# Patient Record
Sex: Female | Born: 1960 | Race: White | Hispanic: No | Smoking: Former smoker
Health system: Southern US, Community
[De-identification: ages and names within clinical notes are randomized; demographics above are authoritative.]

## PROBLEM LIST (undated history)

## (undated) DIAGNOSIS — D249 Benign neoplasm of unspecified breast: Secondary | ICD-10-CM

## (undated) HISTORY — PX: BREAST SURGERY: SHX581

## (undated) HISTORY — PX: TUMOR REMOVAL: SHX12

---

## 2006-04-17 ENCOUNTER — Inpatient Hospital Stay (HOSPITAL_COMMUNITY): Admission: EM | Admit: 2006-04-17 | Discharge: 2006-04-19 | Payer: Self-pay | Admitting: Emergency Medicine

## 2006-04-19 ENCOUNTER — Ambulatory Visit: Payer: Self-pay | Admitting: Oncology

## 2006-04-24 ENCOUNTER — Encounter: Admission: RE | Admit: 2006-04-24 | Discharge: 2006-04-24 | Payer: Self-pay | Admitting: Oncology

## 2006-04-24 ENCOUNTER — Ambulatory Visit (HOSPITAL_COMMUNITY): Payer: Self-pay | Admitting: Oncology

## 2006-04-24 ENCOUNTER — Encounter (HOSPITAL_COMMUNITY): Admission: RE | Admit: 2006-04-24 | Discharge: 2006-05-24 | Payer: Self-pay | Admitting: Oncology

## 2006-06-02 ENCOUNTER — Ambulatory Visit (HOSPITAL_COMMUNITY): Payer: Self-pay | Admitting: Oncology

## 2006-06-02 ENCOUNTER — Encounter: Admission: RE | Admit: 2006-06-02 | Discharge: 2006-06-02 | Payer: Self-pay | Admitting: Oncology

## 2006-06-02 ENCOUNTER — Encounter (HOSPITAL_COMMUNITY): Admission: RE | Admit: 2006-06-02 | Discharge: 2006-07-02 | Payer: Self-pay | Admitting: Oncology

## 2009-02-03 ENCOUNTER — Emergency Department (HOSPITAL_COMMUNITY): Admission: EM | Admit: 2009-02-03 | Discharge: 2009-02-03 | Payer: Self-pay | Admitting: Emergency Medicine

## 2009-04-08 ENCOUNTER — Emergency Department (HOSPITAL_COMMUNITY): Admission: EM | Admit: 2009-04-08 | Discharge: 2009-04-08 | Payer: Self-pay | Admitting: Emergency Medicine

## 2009-04-30 ENCOUNTER — Emergency Department (HOSPITAL_COMMUNITY): Admission: EM | Admit: 2009-04-30 | Discharge: 2009-04-30 | Payer: Self-pay | Admitting: Emergency Medicine

## 2009-05-02 ENCOUNTER — Emergency Department (HOSPITAL_COMMUNITY): Admission: EM | Admit: 2009-05-02 | Discharge: 2009-05-02 | Payer: Self-pay | Admitting: Emergency Medicine

## 2009-09-08 ENCOUNTER — Emergency Department (HOSPITAL_COMMUNITY): Admission: EM | Admit: 2009-09-08 | Discharge: 2009-09-08 | Payer: Self-pay | Admitting: Emergency Medicine

## 2009-09-13 ENCOUNTER — Emergency Department (HOSPITAL_COMMUNITY): Admission: EM | Admit: 2009-09-13 | Discharge: 2009-09-13 | Payer: Self-pay | Admitting: Emergency Medicine

## 2009-09-19 ENCOUNTER — Emergency Department (HOSPITAL_COMMUNITY): Admission: EM | Admit: 2009-09-19 | Discharge: 2009-09-19 | Payer: Self-pay | Admitting: Emergency Medicine

## 2011-01-16 LAB — CULTURE, ROUTINE-ABSCESS

## 2011-02-25 NOTE — H&P (Signed)
NAMESARANN, Lisa Mullins                 ACCOUNT NO.:  [id number]   MEDICAL RECORD NO.:  [id number]          PATIENT TYPE:  INP   LOCATION:  A312                          FACILITY:  [facility]   PHYSICIAN:  Lisa Mullins, M.D.DATE OF BIRTH:  December 10, 1960   DATE OF ADMISSION:  04/17/2006  DATE OF DISCHARGE:  LH                                HISTORY & PHYSICAL   PRIMARY CARE PHYSICIAN:  Unassigned.   ADMISSION DIAGNOSES:  1.  Neutropenia.  2.  Neutropenic fever.  3.  Pharyngitis, probably viral ?EBV infection.  4.  Hypokalemia.  5.  Dehydration.   CHIEF COMPLAINT:  Sore throat and fevers.   HISTORY OF PRESENT ILLNESS:  Lisa Mullins is a 50 year old Caucasian female  who presented to the emergency room with complaints of sore throat. The  patient said she also has had some chills. She has had difficulty swallowing  food. She has also had some nausea but no vomiting. The patient reports some  neck tenderness. The patient said she had a similar episode back in January  of 2007 during which she also had generalized muscle aches and joint pains.  She went to the local hospital where she was told that she had a viral  infection. She was also told that her white count was low at the time. She  spent 7 days in [facility] hospital after which she made a full recovery.   The patient reports having similar symptoms now. Evaluation in the emergency  room revealed that she was febrile. She was also noted to have significant  neutropenia. The patient is now readmitted for further evaluation. Dr.  Mariel Mullins was notified of the admission.   The patient denies any weight loss. No cough. No sputum production. Denies  any contact with anyone with a sore throat who has been ill around her.   She has no little children around the house.   PAST MEDICAL HISTORY:  History of neutropenia with sore throat back in  January of 2007. Otherwise was unremarkable.   MEDICATIONS:  None.   ALLERGIES:   CODEINE.   SOCIAL HISTORY:  The patient is single. Does not drink alcohol. Smokes about  a pack of cigarettes and had a prior history of drug abuse. She works.   FAMILY HISTORY:  The patient says that one of her grandparents had leukemia.  Otherwise, no history of hypertension, diabetes, coronary artery disease or  congestive heart failure.   PHYSICAL EXAMINATION:  GENERAL:  Conscious, alert, comfortable, not in acute  distress.  VITAL SIGNS:  On arrival here, blood pressure was 108/73, pulse 128,  respirations 20, temperature 101.8. Oxygen saturation was 98% on room air.  HEENT:  Normocephalic, atraumatic. Oral mucosa shows erythema. No exudates  were noted. No thrush was seen.  NECK:  She had some tenderness with mild adenitis in the submandibular area.  LUNGS:  Clear clinically with good air entry bilaterally.  HEART:  S1 and S2 tachycardic. No S3, S4, gallops or rubs.  ABDOMEN:  Soft and nontender. Bowel sounds positive. No mass palpable.  EXTREMITIES:  No edema. No induration  or tenderness.  CENTRAL NERVOUS SYSTEM:  Grossly intact with no focal deficits.   LABORATORY/DIAGNOSTIC DATA:  White blood cell count was 2.0, hemoglobin of  11.5, hematocrit 3.5, platelet count 313. ANC of 60. Monocytes were 64%.  Sodium was 135, potassium 2.6, chloride 98, CO2 26, glucose 89, BUN 4,  creatinine 0.7, calcium 9.1. Strep throat test was negative. Blood cultures  have been drawn and are pending.   ASSESSMENT AND PLAN:  Lisa Mullins is a 50 year old Caucasian female  presenting with fever and sore throat. She does have severe neutropenia with  a fever. It is unclear why patient keeps having these neutropenic episodes  usually proceeded by her sore throat. I suspect that this may be Malachi Carl Virus infection. We will send blood for Epstein-Barr serology. The  patient has been started on Fortaz as per recommendation of Dr. Mariel Mullins  who also saw patient in consult.   Will give her some  Chloraseptic spray to help give her some symptomatic  relief of the sore throat. Further tests or treatments will depend on  recommendations as per hematologist.   I have discussed the above plan with patient and the importance of  admission, and she verbalized understanding. Will also replace her potassium  with IV and p.o.      Lisa Mullins, M.D.  Electronically Signed     AM/MEDQ  D:  04/18/2006  T:  04/18/2006  Job:  [id number]

## 2011-02-25 NOTE — Discharge Summary (Signed)
Lisa Mullins, RIEDE NO.:  [id number]   MEDICAL RECORD NO.:  [id number]          PATIENT TYPE:  INP   LOCATION:  A312                          FACILITY:  [facility]   PHYSICIAN:  Osvaldo Shipper, MD     DATE OF BIRTH:  1960-10-18   DATE OF ADMISSION:  04/17/2006  DATE OF DISCHARGE:  07/11/2007LH                                 DISCHARGE SUMMARY   Please note: Patient went against medical advice on April 19, 2006.   Please see H&P dictated at the time of admission for details regarding this  patient's presenting illness.   HOSPITAL COURSE:  Briefly, this is a 50 year old Caucasian female who really  does not have any medical problems except that she was detected to have  neutropenia back in January 2007.  She also had sore throat at that time,  and it was felt the neutropenia was secondary to a viral infection.  Apparently she underwent extensive testing in the form of HIV and other  viral markers which were all negative.  The patient presented to the  emergency department here complaining of sore throat and fevers.  She was  again noted to have neutropenia with a total white count of about 2,000 with  3% neutrophils.  She was also found to be mildly anemic.  Platelet counts  were normal.  MCV was 77.  She was also hypokalemic.   The patient was put on Fortaz based on Dr. Thornton Papas recommendations.  Strep screen was negative.  Blood cultures were done which have all been  negative.  EBV IgM was 0.21 which is really not high.  Her IgG's are high.  HIV test is pending at this hospital. She also underwent iron profile  studies which showed iron to be 12, percent saturation 5, TIBC 241, ferritin  254.  She was not given any Neupogen.  Soon her fever started improving.  Her white count also started improving on its own; however, she was still  neutropenic with a white count of 800 on July 11.   On July 11, when I came in to evaluate the patient, she wanted to be  discharged.  I told her that we would like her to stay at least 1 or 2 more  days to make sure that she is stable.  However, patient really wanted to go  home as she said she could not afford to pay her bills.  Despite multiple  attempts, she signed herself against medical advise.   MEDICATIONS:  Because of the severity of her illness, we did prescribe her:  Ciprofloxacin and Augmentin for 10 days.   FOLLOW-UP:  She was asked to follow up with Dr. Mariel Sleet, and she said she had an  appointment with him next Monday.      Osvaldo Shipper, MD  Electronically Signed     GK/MEDQ  D:  04/19/2006  T:  04/19/2006  Job:  [id number]   cc:   Ladona Horns. Mariel Sleet, MD  Fax: [phone]

## 2013-11-18 ENCOUNTER — Emergency Department (HOSPITAL_COMMUNITY)
Admission: EM | Admit: 2013-11-18 | Discharge: 2013-11-18 | Disposition: A | Discharge status: Home / Self Care | Payer: PRIVATE HEALTH INSURANCE | Attending: Emergency Medicine | Admitting: Emergency Medicine

## 2013-11-18 ENCOUNTER — Emergency Department (HOSPITAL_COMMUNITY): Payer: PRIVATE HEALTH INSURANCE

## 2013-11-18 ENCOUNTER — Encounter (HOSPITAL_COMMUNITY): Payer: Self-pay | Admitting: Emergency Medicine

## 2013-11-18 DIAGNOSIS — R1110 Vomiting, unspecified: Secondary | ICD-10-CM | POA: Insufficient documentation

## 2013-11-18 DIAGNOSIS — J111 Influenza due to unidentified influenza virus with other respiratory manifestations: Secondary | ICD-10-CM | POA: Insufficient documentation

## 2013-11-18 DIAGNOSIS — F17200 Nicotine dependence, unspecified, uncomplicated: Secondary | ICD-10-CM | POA: Insufficient documentation

## 2013-11-18 DIAGNOSIS — R197 Diarrhea, unspecified: Secondary | ICD-10-CM | POA: Insufficient documentation

## 2013-11-18 DIAGNOSIS — Z872 Personal history of diseases of the skin and subcutaneous tissue: Secondary | ICD-10-CM | POA: Insufficient documentation

## 2013-11-18 HISTORY — DX: Benign neoplasm of unspecified breast: D24.9

## 2013-11-18 LAB — CBC WITH DIFFERENTIAL/PLATELET
Basophils Absolute: 0 10*3/uL (ref 0.0–0.1)
Basophils Relative: 1 % (ref 0–1)
EOS PCT: 3 % (ref 0–5)
Eosinophils Absolute: 0.2 10*3/uL (ref 0.0–0.7)
HEMATOCRIT: 43.1 % (ref 36.0–46.0)
Hemoglobin: 14.1 g/dL (ref 12.0–15.0)
LYMPHS ABS: 2.1 10*3/uL (ref 0.7–4.0)
LYMPHS PCT: 32 % (ref 12–46)
MCH: 28.8 pg (ref 26.0–34.0)
MCHC: 32.7 g/dL (ref 30.0–36.0)
MCV: 88 fL (ref 78.0–100.0)
Monocytes Absolute: 0.7 10*3/uL (ref 0.1–1.0)
Monocytes Relative: 11 % (ref 3–12)
NEUTROS ABS: 3.5 10*3/uL (ref 1.7–7.7)
NEUTROS PCT: 54 % (ref 43–77)
Platelets: 331 10*3/uL (ref 150–400)
RBC: 4.9 MIL/uL (ref 3.87–5.11)
RDW: 12.9 % (ref 11.5–15.5)
WBC: 6.5 10*3/uL (ref 4.0–10.5)

## 2013-11-18 LAB — COMPREHENSIVE METABOLIC PANEL
ALT: 12 U/L (ref 0–35)
AST: 15 U/L (ref 0–37)
Albumin: 4.2 g/dL (ref 3.5–5.2)
Alkaline Phosphatase: 94 U/L (ref 39–117)
BUN: 23 mg/dL (ref 6–23)
CO2: 26 mEq/L (ref 19–32)
Calcium: 9.6 mg/dL (ref 8.4–10.5)
Chloride: 102 mEq/L (ref 96–112)
Creatinine, Ser: 0.65 mg/dL (ref 0.50–1.10)
GFR calc Af Amer: >90 mL/min (ref >90)
GLUCOSE: 102 mg/dL — AB (ref 70–99)
Potassium: 4.4 mEq/L (ref 3.7–5.3)
SODIUM: 139 meq/L (ref 137–147)
Total Bilirubin: 0.3 mg/dL (ref 0.3–1.2)
Total Protein: 7.6 g/dL (ref 6.0–8.3)

## 2013-11-18 MED ORDER — OSELTAMIVIR PHOSPHATE 75 MG PO CAPS: 75.0000 mg | ORAL_CAPSULE | Freq: Two times a day (BID) | ORAL | Status: DC

## 2013-11-18 MED ORDER — OSELTAMIVIR PHOSPHATE 75 MG PO CAPS
75.0000 mg | ORAL_CAPSULE | Freq: Once | ORAL | Status: AC
  Administered 2013-11-18: 75 mg via ORAL
  Filled 2013-11-18: qty 1

## 2013-11-18 MED ORDER — IBUPROFEN 800 MG PO TABS: 800.0000 mg | ORAL_TABLET | Freq: Three times a day (TID) | ORAL | Status: DC

## 2013-11-18 MED ORDER — ONDANSETRON HCL 4 MG/2ML IJ SOLN
4.0000 mg | Freq: Once | INTRAMUSCULAR | Status: AC
  Administered 2013-11-18: 4 mg via INTRAVENOUS
  Filled 2013-11-18: qty 2

## 2013-11-18 MED ORDER — SODIUM CHLORIDE 0.9 % IV BOLUS (SEPSIS)
1000.0000 mL | Freq: Once | INTRAVENOUS | Status: AC
  Administered 2013-11-18: 1000 mL via INTRAVENOUS

## 2013-11-18 NOTE — Discharge Instructions (Signed)
Drink plenty of  Fluids and follow up if not improving

## 2013-11-18 NOTE — ED Notes (Signed)
Patient c/o flu like symptoms-fever, cough, generalized body aches, nausea, vomiting, and diarrhea since yesterday. Denies urinary symptoms. Per patient last took tylenol 30 minutes ago.

## 2013-11-18 NOTE — ED Provider Notes (Signed)
CSN: [id number]     Arrival date & time 11/18/13  1445 History  This chart was scribed for Maudry Diego, MD by Elby Beck, ED Scribe. This patient was seen in room APA10/APA10 and the patient's care was started at 4:25 PM.   Chief Complaint  Patient presents with  . Fever    Patient is a 53 y.o. female presenting with cough. The history is provided by the patient. No language interpreter was used.  Cough Cough characteristics:  Productive Sputum characteristics:  Green Severity:  Moderate Onset quality:  Gradual Duration:  2 days Timing:  Intermittent Progression:  Worsening Chronicity:  New Smoker: yes   Relieved by:  None tried Worsened by:  Nothing tried Ineffective treatments:  None tried Associated symptoms: fever, headaches and myalgias   Associated symptoms: no eye discharge     HPI Comments: Lisa Mullins is a 53 y.o. female who presents to the Emergency Department complaining of a cough, productive of green mucous.over the past 2 days. She reports associated headache, fever, generalized mylagias, nausea, episodes of emesis and episodes of diarrhea. ED temperature is 98.2 F. She suspects that she has the flu. She reports taking Tylenol with some relief of her fever. She is a current every day smoker and reports that she normally does not normally have a cough at baseline. She denies urinary symptoms.   Past Medical History  Diagnosis Date  . Benign tumor of breast    Past Surgical History  Procedure Laterality Date  . Breast surgery     Family History  Problem Relation Age of Onset  . Stroke Other    History  Substance Use Topics  . Smoking status: Current Every Day Smoker -- 0.75 packs/day for 20 years    Types: Cigarettes  . Smokeless tobacco: Never Used  . Alcohol Use: No   OB History   Grav Para Term Preterm Abortions TAB SAB Ect Mult Living   1 1 1       1      Review of Systems  Constitutional: Positive for fever. Negative for appetite change  and fatigue.  HENT: Negative for ear discharge and sinus pressure.   Eyes: Negative for discharge.  Respiratory: Positive for cough.   Gastrointestinal: Positive for nausea, vomiting and diarrhea. Negative for abdominal pain.  Genitourinary: Negative for dysuria, frequency and hematuria.  Musculoskeletal: Positive for myalgias. Negative for back pain.  Neurological: Positive for headaches. Negative for seizures.  Psychiatric/Behavioral: Negative for hallucinations.  All other systems reviewed and are negative.   Allergies  Review of patient's allergies indicates no known allergies.  Home Medications   Current Outpatient Rx  Name  Route  Sig  Dispense  Refill  . acetaminophen (TYLENOL) 500 MG tablet   Oral   Take 1,000 mg by mouth every 6 (six) hours as needed.           Triage Vitals: BP 96/68  Pulse 103  Temp(Src) 98.2 F (36.8 C) (Oral)  Resp 18  Ht 5\' 6"  (1.676 m)  Wt 138 lb (62.596 kg)  BMI 22.28 kg/m2  SpO2 96%  Physical Exam  Nursing note and vitals reviewed. Constitutional: She is oriented to person, place, and time. She appears well-developed.  HENT:  Head: Normocephalic.  Mucous membranes are dry.   Eyes: Conjunctivae and EOM are normal. No scleral icterus.  Neck: Neck supple. No thyromegaly present.  Cardiovascular: Normal rate and regular rhythm.  Exam reveals no gallop and no friction rub.  No murmur heard. Pulmonary/Chest: No stridor. She has no wheezes. She has no rales. She exhibits no tenderness.  Abdominal: She exhibits no distension. There is no tenderness. There is no rebound.  Musculoskeletal: Normal range of motion. She exhibits no edema.  Lymphadenopathy:    She has no cervical adenopathy.  Neurological: She is oriented to person, place, and time. She exhibits normal muscle tone. Coordination normal.  Skin: No rash noted. No erythema.  Psychiatric: She has a normal mood and affect. Her behavior is normal.    ED Course  Procedures  (including critical care time)  DIAGNOSTIC STUDIES: Oxygen Saturation is 96% on RA, normal by my interpretation.    COORDINATION OF CARE: 4:28 PM- Discussed plan to obtain a CXR and diagnostic lab work. Will also order IV fluids and Zofran in the ED. Pt advised of plan for treatment and pt agrees.  6:11 PM- Recheck and discussed CXR findings, indicating possible emphysema. Pt admits that she 0.5-0.75 packs/day.    Medications  sodium chloride 0.9 % bolus 1,000 mL (0 mLs Intravenous Stopped 11/18/13 1806)  ondansetron (ZOFRAN) injection 4 mg (4 mg Intravenous Given 11/18/13 1716)   Labs Review Labs Reviewed  COMPREHENSIVE METABOLIC PANEL - Abnormal; Notable for the following:    Glucose, Bld 102 (*)    All other components within normal limits  CBC WITH DIFFERENTIAL   Imaging Review Dg Chest 2 View  11/18/2013   CLINICAL DATA:  Fever, flu like symptoms, history smoking  EXAM: CHEST  2 VIEW  COMPARISON:  04/18/2006  FINDINGS: Upper normal heart size.  Normal mediastinal contours and pulmonary vascularity.  Bronchitic and question mild emphysematous changes raising question of COPD.  No acute infiltrate, pleural effusion or pneumothorax.  Bones appear demineralized.  IMPRESSION: Bronchitic and question mild emphysematous changes raising question of COPD.  No acute abnormalities.   Electronically Signed   By: Lavonia Dana M.D.   On: 11/18/2013 17:51    EKG Interpretation   None       MDM   Final diagnoses:  None   The chart was scribed for me under my direct supervision.  I personally performed the history, physical, and medical decision making and all procedures in the evaluation of this patient.Maudry Diego, MD 11/19/13 [phone]

## 2014-08-11 ENCOUNTER — Encounter (HOSPITAL_COMMUNITY): Payer: Self-pay | Admitting: Emergency Medicine

## 2014-09-30 ENCOUNTER — Emergency Department (HOSPITAL_COMMUNITY)
Admission: EM | Admit: 2014-09-30 | Discharge: 2014-09-30 | Disposition: A | Discharge status: Home / Self Care | Payer: PRIVATE HEALTH INSURANCE | Attending: Emergency Medicine | Admitting: Emergency Medicine

## 2014-09-30 ENCOUNTER — Emergency Department (HOSPITAL_COMMUNITY): Payer: PRIVATE HEALTH INSURANCE

## 2014-09-30 ENCOUNTER — Encounter (HOSPITAL_COMMUNITY): Payer: Self-pay

## 2014-09-30 DIAGNOSIS — Z720 Tobacco use: Secondary | ICD-10-CM | POA: Insufficient documentation

## 2014-09-30 DIAGNOSIS — IMO0001 Reserved for inherently not codable concepts without codable children: Secondary | ICD-10-CM

## 2014-09-30 DIAGNOSIS — Z8742 Personal history of other diseases of the female genital tract: Secondary | ICD-10-CM | POA: Insufficient documentation

## 2014-09-30 DIAGNOSIS — W01198A Fall on same level from slipping, tripping and stumbling with subsequent striking against other object, initial encounter: Secondary | ICD-10-CM | POA: Insufficient documentation

## 2014-09-30 DIAGNOSIS — Y998 Other external cause status: Secondary | ICD-10-CM | POA: Insufficient documentation

## 2014-09-30 DIAGNOSIS — Z79899 Other long term (current) drug therapy: Secondary | ICD-10-CM | POA: Insufficient documentation

## 2014-09-30 DIAGNOSIS — S40021A Contusion of right upper arm, initial encounter: Secondary | ICD-10-CM

## 2014-09-30 DIAGNOSIS — Y9389 Activity, other specified: Secondary | ICD-10-CM | POA: Insufficient documentation

## 2014-09-30 DIAGNOSIS — T1490XA Injury, unspecified, initial encounter: Secondary | ICD-10-CM

## 2014-09-30 DIAGNOSIS — Y929 Unspecified place or not applicable: Secondary | ICD-10-CM | POA: Insufficient documentation

## 2014-09-30 DIAGNOSIS — S40011A Contusion of right shoulder, initial encounter: Secondary | ICD-10-CM | POA: Insufficient documentation

## 2014-09-30 MED ORDER — ACETAMINOPHEN-CODEINE #3 300-30 MG PO TABS: 1.0000 | ORAL_TABLET | Freq: Four times a day (QID) | ORAL | Status: DC | PRN

## 2014-09-30 MED ORDER — IBUPROFEN 800 MG PO TABS: 800.0000 mg | ORAL_TABLET | Freq: Three times a day (TID) | ORAL | Status: DC

## 2014-09-30 MED ORDER — HYDROCODONE-ACETAMINOPHEN 5-325 MG PO TABS: 1.0000 | ORAL_TABLET | ORAL | Status: DC | PRN

## 2014-09-30 NOTE — ED Notes (Signed)
Patient allowed said nurse to apply sling arm foam to left arm. Patient tolerated well.

## 2014-09-30 NOTE — ED Notes (Signed)
Pt reports tripped and fell yesterday. C/O pain to left shoulder.

## 2014-09-30 NOTE — ED Notes (Addendum)
Went into room to discharge patient. Gave patient information on discharge prescriptions including tylenol # 3 and patient stated "I can't take that, it gives me a headache." When attempting to give patient discharge instructions, patient states "I could have told you that I didn't have a fracture or dislocation. I need to see the doctor back in here and I am going to call my doctor while he is in the room. I'm not putting up with this." Advised Lily Kocher, PA.

## 2014-09-30 NOTE — ED Notes (Signed)
Patient states "I don't appreciate you looking at me like that. I am not a dope fiend and because of all these dope addicts I can't get what I need for pain. He made me feel like a dope fiend and you made me feel like a dope fiend. I am sick of being treated this way. I have to pay higher prices at the store because people steal stuff all the time." Patient also states "My doctor in [place] normally gives me hydrocodone 5's for my pain."

## 2014-09-30 NOTE — ED Provider Notes (Signed)
CSN: [id number]     Arrival date & time 09/30/14  1157 History   First MD Initiated Contact with Patient 09/30/14 1232     Chief Complaint  Patient presents with  . Shoulder Pain     (Consider location/radiation/quality/duration/timing/severity/associated sxs/prior Treatment) HPI Comments: Patient is a 53 year old female who presents to the emergency department with left shoulder pain. The patient states that on last night she stumbled over her dog, fell, and injured the left shoulder. She complains she has even more pain today than she did on last night. She can move the shoulder, but it gives her "a great deal of pain". The patient has not had any previous operation on the shoulder, however she has had disc surgery on her neck. She is also concerned as to whether or not she could've done anything to the disc in her neck that would be causing her shoulder to hurt so bad". The patient denies being on any anticoagulation medications. Resting the shoulder helps some, but movement of the shoulder causes increase of the pain.  Patient is a 53 y.o. female presenting with shoulder pain. The history is provided by the patient.  Shoulder Pain Associated symptoms: no back pain and no neck pain     Past Medical History  Diagnosis Date  . Benign tumor of breast    Past Surgical History  Procedure Laterality Date  . Breast surgery     Family History  Problem Relation Age of Onset  . Stroke Other    History  Substance Use Topics  . Smoking status: Current Every Day Smoker -- 0.75 packs/day for 20 years    Types: Cigarettes  . Smokeless tobacco: Never Used  . Alcohol Use: No   OB History    Gravida Para Term Preterm AB TAB SAB Ectopic Multiple Living   1 1 1       1      Review of Systems  Constitutional: Negative for activity change.       All ROS Neg except as noted in HPI  HENT: Negative for nosebleeds.   Eyes: Negative for photophobia and discharge.  Respiratory: Negative for  cough, shortness of breath and wheezing.   Cardiovascular: Negative for chest pain and palpitations.  Gastrointestinal: Negative for abdominal pain and blood in stool.  Genitourinary: Negative for dysuria, frequency and hematuria.  Musculoskeletal: Positive for arthralgias. Negative for back pain and neck pain.  Skin: Negative.   Neurological: Negative for dizziness, seizures and speech difficulty.  Psychiatric/Behavioral: Negative for hallucinations and confusion.      Allergies  Review of patient's allergies indicates no known allergies.  Home Medications   Prior to Admission medications   Medication Sig Start Date End Date Taking? Authorizing Provider  acetaminophen (TYLENOL) 500 MG tablet Take 1,000 mg by mouth every 6 (six) hours as needed for mild pain.    Yes Historical Provider, MD  ibuprofen (ADVIL,MOTRIN) 800 MG tablet Take 1 tablet (800 mg total) by mouth 3 (three) times daily. Patient not taking: Reported on 09/30/2014 11/18/13   Maudry Diego, MD  oseltamivir (TAMIFLU) 75 MG capsule Take 1 capsule (75 mg total) by mouth every 12 (twelve) hours. Patient not taking: Reported on 09/30/2014 11/18/13   Maudry Diego, MD   BP 134/84 mmHg  Pulse 95  Temp(Src) 98.3 F (36.8 C) (Oral)  Resp 20  Ht 5\' 6"  (1.676 m)  Wt 122 lb (55.339 kg)  BMI 19.70 kg/m2  SpO2 100% Physical Exam  Constitutional: She  is oriented to person, place, and time. She appears well-developed and well-nourished.  Non-toxic appearance.  HENT:  Head: Normocephalic.  Right Ear: Tympanic membrane and external ear normal.  Left Ear: Tympanic membrane and external ear normal.  Eyes: EOM and lids are normal. Pupils are equal, round, and reactive to light.  Neck: Normal range of motion. Neck supple. Carotid bruit is not present.  Cardiovascular: Normal rate, regular rhythm, normal heart sounds, intact distal pulses and normal pulses.   Pulmonary/Chest: Breath sounds normal. No respiratory distress.   Abdominal: Soft. Bowel sounds are normal. There is no tenderness. There is no guarding.  Musculoskeletal: Normal range of motion.  There is full range of motion of the fingers, wrist, and elbow of the left upper extremity. There is no deformity of the humerus area. There is pain to palpation around the scapula area, but no evidence of scapula dislocation. There is no pain or discomfort of the clavicle. No deformity appreciated. There is pain to palpation and attempted range of motion of the shoulder on the left. There is no effusion appreciated. The radial pulses are 2+.  Lymphadenopathy:       Head (right side): No submandibular adenopathy present.       Head (left side): No submandibular adenopathy present.    She has no cervical adenopathy.  Neurological: She is alert and oriented to person, place, and time. She has normal strength. No cranial nerve deficit or sensory deficit. She exhibits normal muscle tone. Coordination normal.  Skin: Skin is warm and dry.  Psychiatric: She has a normal mood and affect. Her speech is normal.  Nursing note and vitals reviewed.   ED Course  At the time of discharge the patient was being fitted with a sling when she said to the nurse that she did not appreciate the way I looked at her. She said I looked at her as though she was a "dope pain". The patient states that she cannot take Tylenol codeine. She states it gives her a migraine headache. This information is not in her file under the allergy status. She requests to speak with me in her room again.  Chaperoned by nurse L. Shore. I asked the patient what I had done that made her feel that I was looking at her as a dope pain. She says that when she asked for something for pain that I looked at her strange. She went on to say that she does not abuse drugs, however she did mention that her son is currently incarcerated because of drug use. Later she went on to say how upset she is that holiday is here and she  will be without her son. I reminded the patient that we did not have it on file that Tylenol codeine causes her to have migraines. I have written a prescription for Norco 5/325 #15 tablets. I've encouraged the patient to see her physician in [place] if she should have any additional pain management problems, or to return to the emergency department if any emergent changes.   Procedures (including critical care time) Labs Review Labs Reviewed - No data to display  Imaging Review Dg Shoulder Left  09/30/2014   CLINICAL DATA:  Fall yesterday.  Pain  EXAM: LEFT SHOULDER - 2+ VIEW  COMPARISON:  None.  FINDINGS: There is no evidence of fracture or dislocation. There is no evidence of arthropathy or other focal bone abnormality. Soft tissues are unremarkable.  IMPRESSION: Negative.   Electronically Signed   By:  Franchot Gallo M.D.   On: 09/30/2014 12:58     EKG Interpretation None      MDM  X-ray of the left shoulder is negative for fracture or dislocation. Vital signs are well within normal limits. No neurovascular compromise appreciated on upper extremity examination.  Patient will be placed in a sling, she'll be treated with ibuprofen and Tylenol codeine. Patient is to follow-up with her primary physician next week.    Final diagnoses:  Injury    *I have reviewed nursing notes, vital signs, and all appropriate lab and imaging results for this patient.**    Lenox Ahr, PA-C 09/30/14 [address], PA-C 09/30/14 1433  Nat Christen, MD 09/30/14 [phone]

## 2014-09-30 NOTE — Discharge Instructions (Signed)
Your x-ray is negative for fracture or dislocation. Your examination is negative for any acute neurological or vascular compromise at this time. Please see your primary physician, or your specialist as sone as possible. May use ibuprofen 3 times daily for soreness. May use Tylenol codeine every 6 hours if needed for pain. Tylenol codeine may cause drowsiness, please use with caution.

## 2015-05-13 IMAGING — CR DG SHOULDER 2+V*L*
3 series · 3 of 3 positions shown · non-contrast
Comparison: None.

CLINICAL DATA: Fall yesterday.  Pain

EXAM:
LEFT SHOULDER - 2+ VIEW

[view not recorded (1 of 3)]
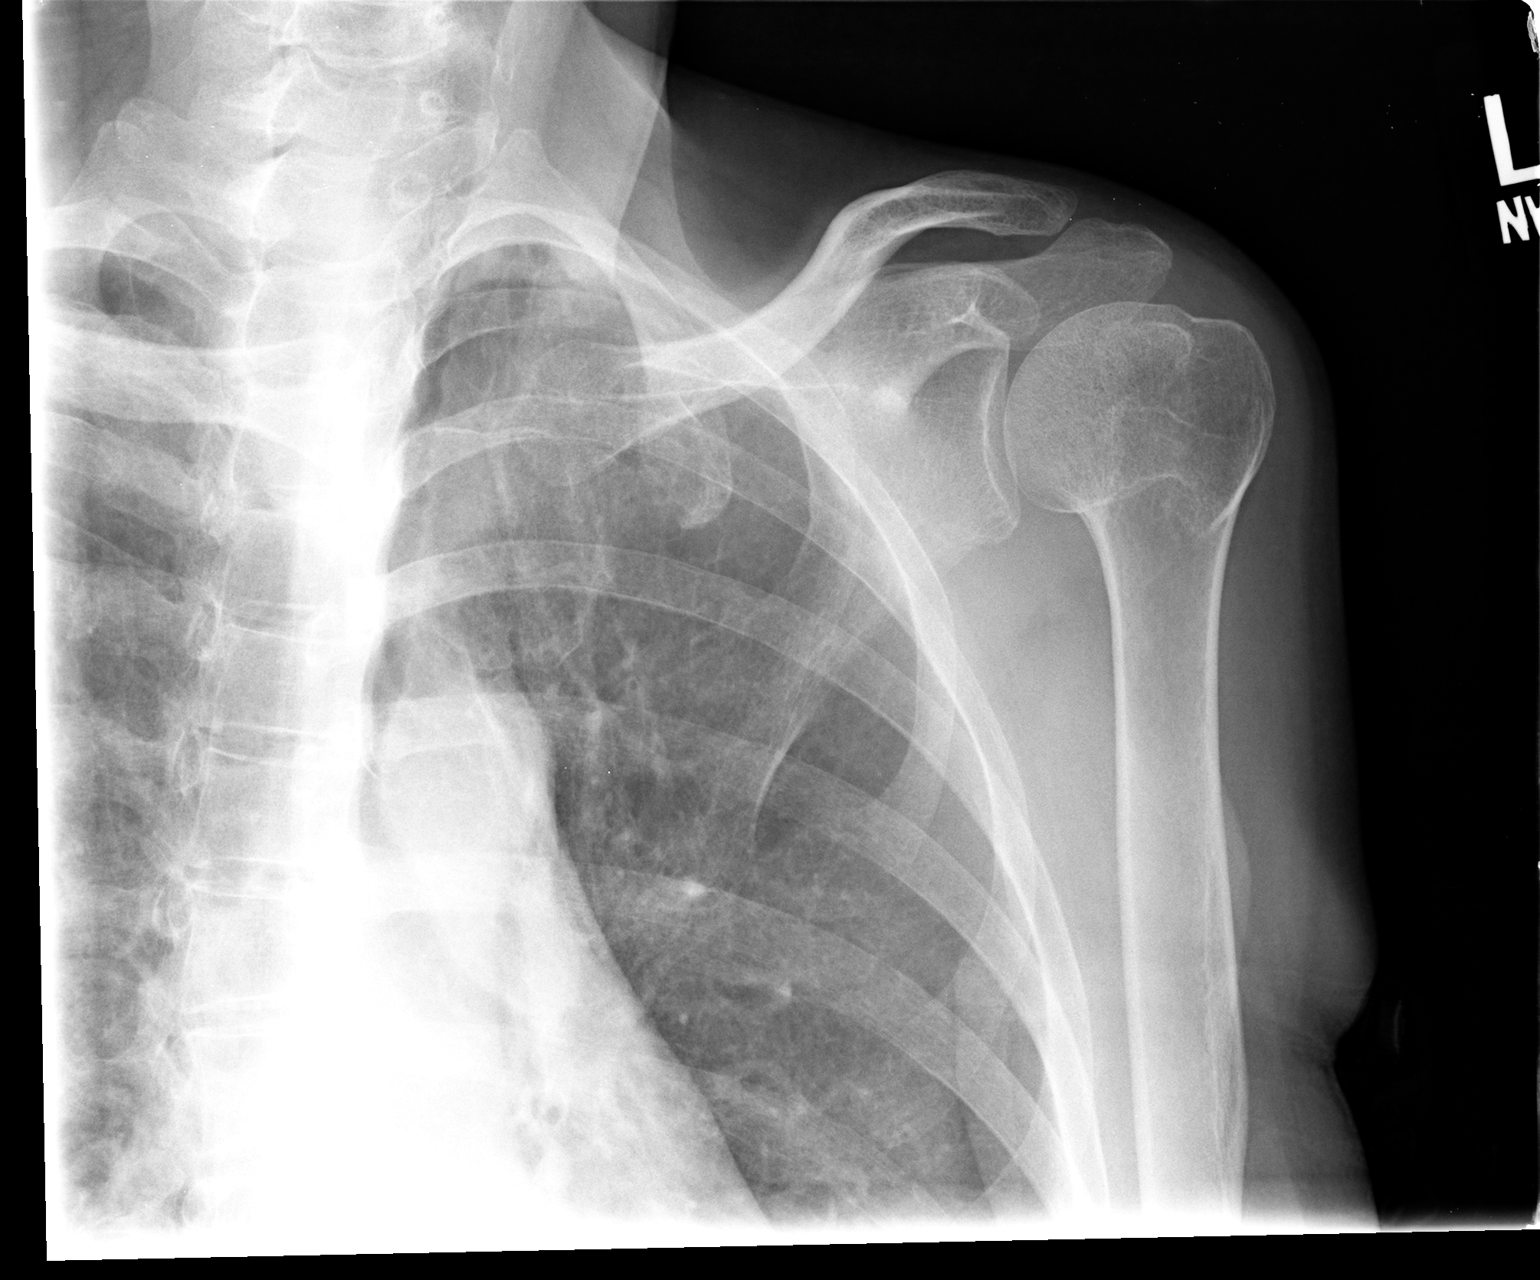

[view not recorded (2 of 3)]
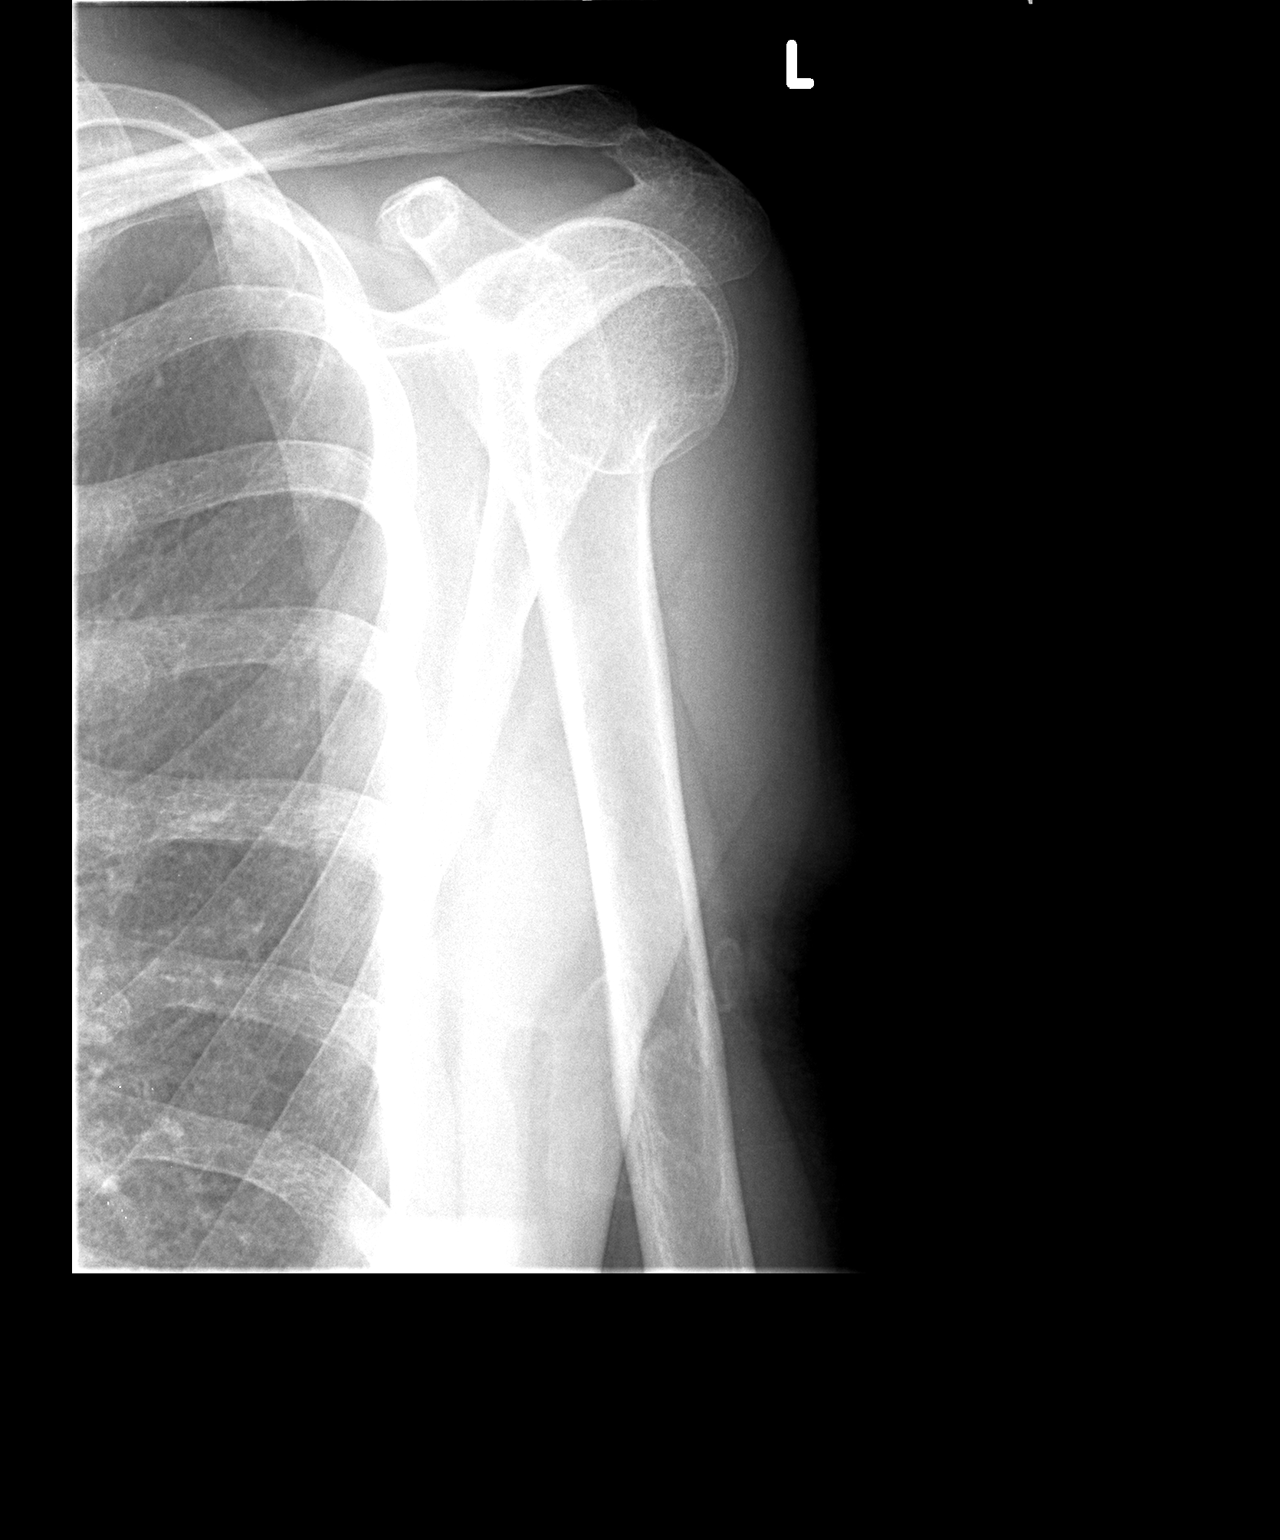

[view not recorded (3 of 3)]
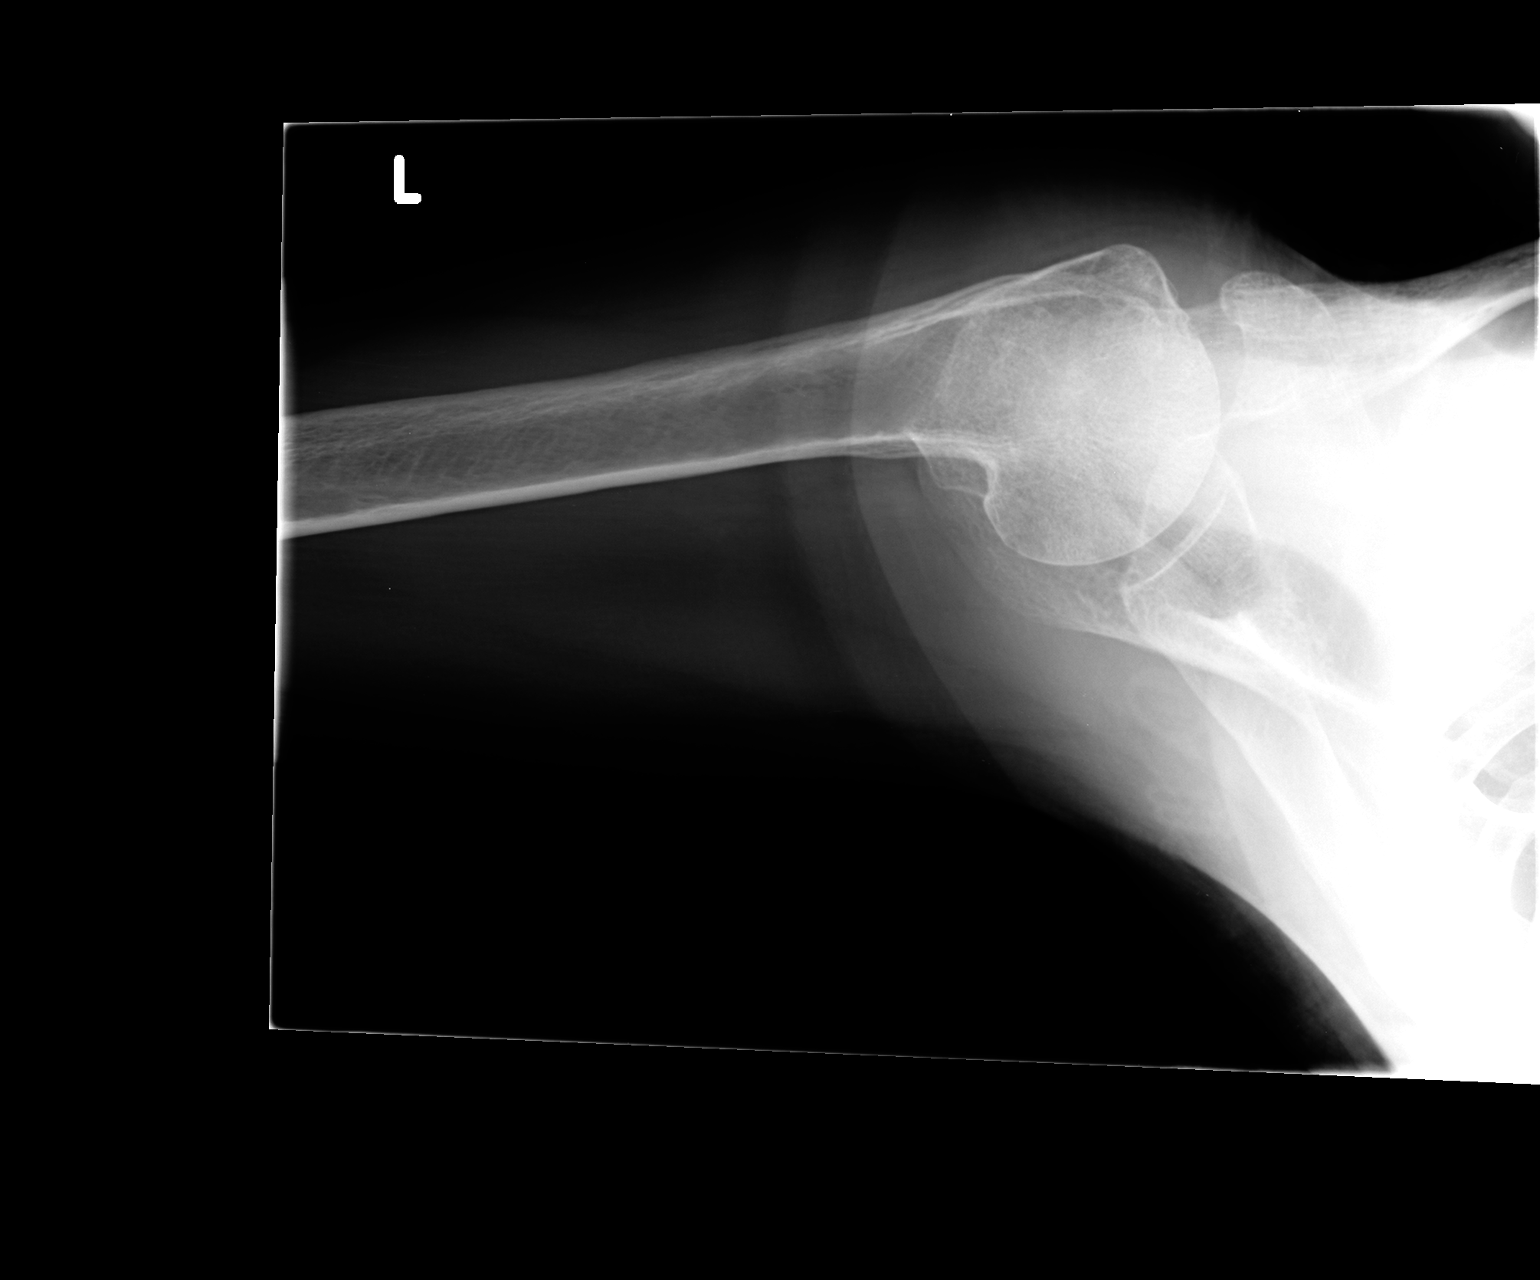

[3 of 3 positions shown; findings below may reference images not displayed]

FINDINGS: There is no evidence of fracture or dislocation. There is no
evidence of arthropathy or other focal bone abnormality. Soft
tissues are unremarkable.
IMPRESSION: Negative.

## 2015-11-01 ENCOUNTER — Emergency Department (HOSPITAL_COMMUNITY)
Admission: EM | Admit: 2015-11-01 | Discharge: 2015-11-01 | Disposition: A | Discharge status: Home / Self Care | Payer: Self-pay | Attending: Emergency Medicine | Admitting: Emergency Medicine

## 2015-11-01 ENCOUNTER — Encounter (HOSPITAL_COMMUNITY): Payer: Self-pay | Admitting: Emergency Medicine

## 2015-11-01 DIAGNOSIS — R05 Cough: Secondary | ICD-10-CM | POA: Insufficient documentation

## 2015-11-01 DIAGNOSIS — F17210 Nicotine dependence, cigarettes, uncomplicated: Secondary | ICD-10-CM | POA: Insufficient documentation

## 2015-11-01 NOTE — ED Notes (Signed)
Pt reports fever, cough, congestion, h/a, diarrhea, emesis x1 week.  Pt unable to keep food down.  Pt alert and oriented, ambulatory.

## 2015-11-01 NOTE — ED Notes (Signed)
Per nurse in main side of ED, pt was seen leaving ED. Ambulatory.

## 2015-11-04 ENCOUNTER — Emergency Department (HOSPITAL_COMMUNITY)
Admission: EM | Admit: 2015-11-04 | Discharge: 2015-11-04 | Disposition: A | Discharge status: Home / Self Care | Payer: Self-pay | Attending: Emergency Medicine | Admitting: Emergency Medicine

## 2015-11-04 ENCOUNTER — Emergency Department (HOSPITAL_COMMUNITY): Payer: Self-pay

## 2015-11-04 ENCOUNTER — Encounter (HOSPITAL_COMMUNITY): Payer: Self-pay | Admitting: Emergency Medicine

## 2015-11-04 DIAGNOSIS — J40 Bronchitis, not specified as acute or chronic: Secondary | ICD-10-CM

## 2015-11-04 DIAGNOSIS — F17210 Nicotine dependence, cigarettes, uncomplicated: Secondary | ICD-10-CM | POA: Insufficient documentation

## 2015-11-04 DIAGNOSIS — J209 Acute bronchitis, unspecified: Secondary | ICD-10-CM | POA: Insufficient documentation

## 2015-11-04 LAB — CBC WITH DIFFERENTIAL/PLATELET
BASOS ABS: 0.1 10*3/uL (ref 0.0–0.1)
BASOS PCT: 1 %
EOS PCT: 4 %
Eosinophils Absolute: 0.2 10*3/uL (ref 0.0–0.7)
HCT: 42.5 % (ref 36.0–46.0)
Hemoglobin: 14 g/dL (ref 12.0–15.0)
Lymphocytes Relative: 28 %
Lymphs Abs: 1.6 10*3/uL (ref 0.7–4.0)
MCH: 29.4 pg (ref 26.0–34.0)
MCHC: 32.9 g/dL (ref 30.0–36.0)
MCV: 89.1 fL (ref 78.0–100.0)
MONO ABS: 0.4 10*3/uL (ref 0.1–1.0)
Monocytes Relative: 7 %
Neutro Abs: 3.5 10*3/uL (ref 1.7–7.7)
Neutrophils Relative %: 60 %
PLATELETS: 269 10*3/uL (ref 150–400)
RBC: 4.77 MIL/uL (ref 3.87–5.11)
RDW: 12.4 % (ref 11.5–15.5)
WBC: 5.7 10*3/uL (ref 4.0–10.5)

## 2015-11-04 LAB — COMPREHENSIVE METABOLIC PANEL
ALBUMIN: 4.1 g/dL (ref 3.5–5.0)
ALT: 10 U/L — ABNORMAL LOW (ref 14–54)
AST: 14 U/L — AB (ref 15–41)
Alkaline Phosphatase: 79 U/L (ref 38–126)
Anion gap: 6 (ref 5–15)
BUN: 16 mg/dL (ref 6–20)
CHLORIDE: 105 mmol/L (ref 101–111)
CO2: 28 mmol/L (ref 22–32)
Calcium: 9.2 mg/dL (ref 8.9–10.3)
Creatinine, Ser: 0.65 mg/dL (ref 0.44–1.00)
GFR calc Af Amer: >60 mL/min (ref >60)
GLUCOSE: 88 mg/dL (ref 65–99)
POTASSIUM: 4.3 mmol/L (ref 3.5–5.1)
SODIUM: 139 mmol/L (ref 135–145)
Total Bilirubin: 0.4 mg/dL (ref 0.3–1.2)
Total Protein: 6.9 g/dL (ref 6.5–8.1)

## 2015-11-04 MED ORDER — ONDANSETRON HCL 4 MG/2ML IJ SOLN
4.0000 mg | Freq: Once | INTRAMUSCULAR | Status: AC
  Administered 2015-11-04: 4 mg via INTRAVENOUS
  Filled 2015-11-04: qty 2

## 2015-11-04 MED ORDER — AZITHROMYCIN 250 MG PO TABS: ORAL_TABLET | ORAL | Status: DC

## 2015-11-04 MED ORDER — ONDANSETRON 4 MG PO TBDP: ORAL_TABLET | ORAL | Status: DC

## 2015-11-04 MED ORDER — IBUPROFEN 800 MG PO TABS: 800.0000 mg | ORAL_TABLET | Freq: Three times a day (TID) | ORAL | Status: DC | PRN

## 2015-11-04 MED ORDER — SODIUM CHLORIDE 0.9 % IV BOLUS (SEPSIS)
1000.0000 mL | Freq: Once | INTRAVENOUS | Status: AC
  Administered 2015-11-04: 1000 mL via INTRAVENOUS

## 2015-11-04 MED ORDER — KETOROLAC TROMETHAMINE 30 MG/ML IJ SOLN
30.0000 mg | Freq: Once | INTRAMUSCULAR | Status: AC
  Administered 2015-11-04: 30 mg via INTRAVENOUS
  Filled 2015-11-04: qty 1

## 2015-11-04 NOTE — ED Notes (Signed)
Pt reports on-going fever, cough, malaise, and diarrhea for over a week. Pt states she has been managing fever at home with Tylenol. Pt took Tylenol at 1100 this morning.

## 2015-11-04 NOTE — Discharge Instructions (Signed)
Drink plenty of fluids and follow-up next week if not improving

## 2015-11-04 NOTE — ED Provider Notes (Signed)
CSN: [id number]     Arrival date & time 11/04/15  1246 History   First MD Initiated Contact with Patient 11/04/15 1321     Chief Complaint  Patient presents with  . Fever     (Consider location/radiation/quality/duration/timing/severity/associated sxs/prior Treatment) Patient is a 55 y.o. female presenting with fever. The history is provided by the patient (Patient complains of cough congestion nausea aches for over week).  Fever Temp source:  Subjective and oral Severity:  Mild Onset quality:  Sudden Timing:  Constant Progression:  Waxing and waning Chronicity:  New Worsened by:  Nothing tried Associated symptoms: chills and cough   Associated symptoms: no chest pain, no congestion, no diarrhea, no headaches and no rash     History reviewed. No pertinent past medical history. Past Surgical History  Procedure Laterality Date  . 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 Social History  Substance Use Topics  . Smoking status: Current Some Day Smoker -- 0.50 packs/day    Types: Cigarettes  . Smokeless tobacco: None  . Alcohol Use: No   OB History    No data available     Review of Systems  Constitutional: Positive for fever and chills. Negative for appetite change and fatigue.  HENT: Negative for congestion, ear discharge and sinus pressure.   Eyes: Negative for discharge.  Respiratory: Positive for cough.   Cardiovascular: Negative for chest pain.  Gastrointestinal: Negative for abdominal pain and diarrhea.  Genitourinary: Negative for frequency and hematuria.  Musculoskeletal: Negative for back pain.  Skin: Negative for rash.  Neurological: Negative for seizures and headaches.  Psychiatric/Behavioral: Negative for hallucinations.      Allergies  Review of patient's allergies indicates no known allergies.  Home Medications   Prior to Admission medications   Medication Sig Start Date End Date Taking? Authorizing Provider   acetaminophen (TYLENOL) 500 MG tablet Take 1,000 mg by mouth every 6 (six) hours as needed for fever.   Yes Historical Provider, MD  Phenylephrine-DM-GG-APAP (TYLENOL COLD/FLU SEVERE PO) Take 2 tablets by mouth daily.   Yes Historical Provider, MD  azithromycin (ZITHROMAX Z-PAK) 250 MG tablet 2 po day one, then 1 daily x 4 days 11/04/15   Milton Ferguson, MD  ibuprofen (ADVIL,MOTRIN) 800 MG tablet Take 1 tablet (800 mg total) by mouth every 8 (eight) hours as needed for moderate pain. 11/04/15   Milton Ferguson, MD  ondansetron (ZOFRAN ODT) 4 MG disintegrating tablet 4mg  ODT q4 hours prn nausea/vomit 11/04/15   Milton Ferguson, MD   BP 115/78 mmHg  Pulse 75  Temp(Src) 98 F (36.7 C) (Oral)  Resp 15  Ht 5\' 7"  (1.702 m)  Wt 128 lb (58.06 kg)  BMI 20.04 kg/m2  SpO2 100% Physical Exam  Constitutional: She is oriented to person, place, and time. She appears well-developed.  HENT:  Head: Normocephalic.  Eyes: Conjunctivae and EOM are normal. No scleral icterus.  Neck: Neck supple. No thyromegaly present.  Cardiovascular: Normal rate and regular rhythm.  Exam reveals no gallop and no friction rub.   No murmur heard. Pulmonary/Chest: No stridor. She has no wheezes. She has no rales. She exhibits no tenderness.  Abdominal: She exhibits no distension. There is no tenderness. There is no rebound.  Musculoskeletal: Normal range of motion. She exhibits no edema.  Lymphadenopathy:    She has no cervical adenopathy.  Neurological: She is oriented to person, place, and time. She exhibits normal muscle tone. Coordination  normal.  Skin: No rash noted. No erythema.  Psychiatric: She has a normal mood and affect. Her behavior is normal.    ED Course  Procedures (including critical care time) Labs Review Labs Reviewed  COMPREHENSIVE METABOLIC PANEL - Abnormal; Notable for the following:    AST 14 (*)    ALT 10 (*)    All other components within normal limits  CBC WITH DIFFERENTIAL/PLATELET    Imaging  Review Dg Chest 2 View  11/04/2015  CLINICAL DATA:  Nonproductive cough, chest pain and fever for 10 days. EXAM: CHEST  2 VIEW COMPARISON:  None. FINDINGS: The cardiac silhouette, mediastinal and hilar contours are normal. Suspect underlying chronic emphysematous changes and mild hyperinflation. Upper lobe and right basilar scarring changes. No infiltrates or effusions. The bony thorax is intact. IMPRESSION: Emphysematous changes and pulmonary scarring but no acute pulmonary findings. Electronically Signed   By: Marijo Sanes M.D.   On: 11/04/2015 13:43   I have personally reviewed and evaluated these images and lab results as part of my medical decision-making.   EKG Interpretation None      MDM   Final diagnoses:  Bronchitis    Labs unremarkable chest x-ray shows bronchitis,  suspect patient has respiratory infection. Will treat with Z-Pak Zofran and Motrin. She will follow-up next week if not improving    Milton Ferguson, MD 11/04/15 1536

## 2015-11-06 ENCOUNTER — Encounter (HOSPITAL_COMMUNITY): Payer: Self-pay | Admitting: Emergency Medicine

## 2015-12-15 ENCOUNTER — Encounter (HOSPITAL_COMMUNITY): Payer: Self-pay | Admitting: Emergency Medicine

## 2015-12-15 ENCOUNTER — Emergency Department (HOSPITAL_COMMUNITY)
Admission: EM | Admit: 2015-12-15 | Discharge: 2015-12-15 | Disposition: A | Discharge status: Home / Self Care | Payer: PRIVATE HEALTH INSURANCE | Attending: Emergency Medicine | Admitting: Emergency Medicine

## 2015-12-15 DIAGNOSIS — Y999 Unspecified external cause status: Secondary | ICD-10-CM | POA: Insufficient documentation

## 2015-12-15 DIAGNOSIS — T25129A Burn of first degree of unspecified foot, initial encounter: Secondary | ICD-10-CM | POA: Insufficient documentation

## 2015-12-15 DIAGNOSIS — F17210 Nicotine dependence, cigarettes, uncomplicated: Secondary | ICD-10-CM | POA: Insufficient documentation

## 2015-12-15 DIAGNOSIS — Y939 Activity, unspecified: Secondary | ICD-10-CM | POA: Insufficient documentation

## 2015-12-15 DIAGNOSIS — X100XXA Contact with hot drinks, initial encounter: Secondary | ICD-10-CM | POA: Insufficient documentation

## 2015-12-15 DIAGNOSIS — Z791 Long term (current) use of non-steroidal anti-inflammatories (NSAID): Secondary | ICD-10-CM | POA: Insufficient documentation

## 2015-12-15 DIAGNOSIS — Z79891 Long term (current) use of opiate analgesic: Secondary | ICD-10-CM | POA: Insufficient documentation

## 2015-12-15 DIAGNOSIS — Z79899 Other long term (current) drug therapy: Secondary | ICD-10-CM | POA: Insufficient documentation

## 2015-12-15 DIAGNOSIS — T300 Burn of unspecified body region, unspecified degree: Secondary | ICD-10-CM

## 2015-12-15 DIAGNOSIS — Y929 Unspecified place or not applicable: Secondary | ICD-10-CM | POA: Insufficient documentation

## 2015-12-15 DIAGNOSIS — T25222A Burn of second degree of left foot, initial encounter: Secondary | ICD-10-CM | POA: Insufficient documentation

## 2015-12-15 MED ORDER — SILVER SULFADIAZINE 1 % EX CREA
TOPICAL_CREAM | Freq: Once | CUTANEOUS | Status: AC
  Administered 2015-12-15: 17:00:00 via TOPICAL
  Filled 2015-12-15: qty 50
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
## 2015-12-15 NOTE — Discharge Instructions (Signed)
Burn Care °Burns hurt your skin. When your skin is hurt, it is easier to get an infection. Follow your doctor's directions to help prevent an infection. °HOME CARE °· Wash your hands well before you change your bandage. °· Change your bandage as often as told by your doctor. °¨ Remove the old bandage. If the bandage sticks, soak it off with cool, clean water. °¨ Gently clean the burn with mild soap and water. °¨ Pat the burn dry with a clean, dry cloth. °¨ Put a thin layer of medicated cream on the burn. °¨ Put a clean bandage on as told by your doctor. °¨ Keep the bandage clean and dry. °· Raise (elevate) the burn for the first 24 hours. After that, follow your doctor's directions. °· Only take medicine as told by your doctor. °GET HELP RIGHT AWAY IF:  °· You have too much pain. °· The skin near the burn is red, tender, puffy (swollen), or has red streaks. °· The burn area has yellowish white fluid (pus) or a bad smell coming from it. °· You have a fever. °MAKE SURE YOU:  °· Understand these instructions. °· Will watch your condition. °· Will get help right away if you are not doing well or get worse. °  °This information is not intended to replace advice given to you by your health care provider. Make sure you discuss any questions you have with your health care provider. °  °Document Released: 07/05/2008 Document Revised: 12/19/2011 Document Reviewed: 02/16/2011 °Elsevier Interactive Patient Education ©2016 [employer] ° °
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
## 2024-09-09 ENCOUNTER — Other Ambulatory Visit: Payer: Self-pay

## 2024-09-09 ENCOUNTER — Emergency Department (HOSPITAL_COMMUNITY)

## 2024-09-09 ENCOUNTER — Inpatient Hospital Stay (HOSPITAL_COMMUNITY)
Admission: EM | Admit: 2024-09-09 | Discharge: 2024-09-12 | DRG: 482 | Disposition: A | Attending: Family Medicine | Admitting: Family Medicine

## 2024-09-09 ENCOUNTER — Encounter (HOSPITAL_COMMUNITY): Payer: Self-pay

## 2024-09-09 ENCOUNTER — Inpatient Hospital Stay (HOSPITAL_COMMUNITY)

## 2024-09-09 DIAGNOSIS — Z720 Tobacco use: Secondary | ICD-10-CM | POA: Diagnosis present

## 2024-09-09 DIAGNOSIS — K219 Gastro-esophageal reflux disease without esophagitis: Secondary | ICD-10-CM | POA: Diagnosis present

## 2024-09-09 DIAGNOSIS — S72002A Fracture of unspecified part of neck of left femur, initial encounter for closed fracture: Principal | ICD-10-CM | POA: Diagnosis present

## 2024-09-09 LAB — CBC WITH DIFFERENTIAL/PLATELET
Abs Immature Granulocytes: 0.04 K/uL (ref 0.00–0.07)
Basophils Absolute: 0.1 K/uL (ref 0.0–0.1)
Basophils Relative: 1 %
Eosinophils Absolute: 0.1 K/uL (ref 0.0–0.5)
Eosinophils Relative: 2 %
HCT: 43.2 % (ref 36.0–46.0)
Hemoglobin: 14.1 g/dL (ref 12.0–15.0)
Immature Granulocytes: 1 %
Lymphocytes Relative: 14 %
Lymphs Abs: 1.1 K/uL (ref 0.7–4.0)
MCH: 29.1 pg (ref 26.0–34.0)
MCHC: 32.6 g/dL (ref 30.0–36.0)
MCV: 89.3 fL (ref 80.0–100.0)
Monocytes Absolute: 0.4 K/uL (ref 0.1–1.0)
Monocytes Relative: 6 %
Neutro Abs: 5.9 K/uL (ref 1.7–7.7)
Neutrophils Relative %: 76 %
Platelets: 319 K/uL (ref 150–400)
RBC: 4.84 MIL/uL (ref 3.87–5.11)
RDW: 12.8 % (ref 11.5–15.5)
WBC: 7.6 K/uL (ref 4.0–10.5)
nRBC: 0 % (ref 0.0–0.2)

## 2024-09-09 LAB — COMPREHENSIVE METABOLIC PANEL WITH GFR
ALT: 13 U/L (ref 0–44)
AST: 22 U/L (ref 15–41)
Albumin: 4.8 g/dL (ref 3.5–5.0)
Alkaline Phosphatase: 113 U/L (ref 38–126)
Anion gap: 13 (ref 5–15)
BUN: 13 mg/dL (ref 8–23)
CO2: 23 mmol/L (ref 22–32)
Calcium: 9 mg/dL (ref 8.9–10.3)
Chloride: 103 mmol/L (ref 98–111)
Creatinine, Ser: 0.54 mg/dL (ref 0.44–1.00)
GFR, Estimated: >60 mL/min (ref >60)
Glucose, Bld: 121 mg/dL — ABNORMAL HIGH (ref 70–99)
Potassium: 3.5 mmol/L (ref 3.5–5.1)
Sodium: 138 mmol/L (ref 135–145)
Total Bilirubin: 0.3 mg/dL (ref 0.0–1.2)
Total Protein: 7.3 g/dL (ref 6.5–8.1)

## 2024-09-09 LAB — PROTIME-INR
INR: 0.9 (ref 0.8–1.2)
Prothrombin Time: 13.1 s (ref 11.4–15.2)

## 2024-09-09 MED ORDER — ONDANSETRON HCL 4 MG/2ML IJ SOLN: 4.0000 mg | Freq: Four times a day (QID) | INTRAMUSCULAR | Status: DC | PRN

## 2024-09-09 MED ORDER — LORAZEPAM 0.5 MG PO TABS
0.5000 mg | ORAL_TABLET | Freq: Once | ORAL | Status: AC
  Administered 2024-09-09: 0.5 mg via ORAL
  Filled 2024-09-09: qty 1

## 2024-09-09 MED ORDER — MORPHINE SULFATE (PF) 2 MG/ML IV SOLN
2.0000 mg | INTRAVENOUS | Status: DC | PRN
  Administered 2024-09-09 – 2024-09-10 (×4): 2 mg via INTRAVENOUS
  Filled 2024-09-09 (×4): qty 1

## 2024-09-09 MED ORDER — HYDROMORPHONE HCL 1 MG/ML IJ SOLN
0.5000 mg | Freq: Once | INTRAMUSCULAR | Status: AC
  Administered 2024-09-09: 0.5 mg via INTRAVENOUS
  Filled 2024-09-09: qty 0.5

## 2024-09-09 MED ORDER — ACETAMINOPHEN 325 MG PO TABS
650.0000 mg | ORAL_TABLET | Freq: Four times a day (QID) | ORAL | Status: DC | PRN
  Administered 2024-09-09 – 2024-09-10 (×2): 650 mg via ORAL
  Filled 2024-09-09 (×2): qty 2

## 2024-09-09 MED ORDER — ONDANSETRON HCL 4 MG/2ML IJ SOLN
4.0000 mg | Freq: Once | INTRAMUSCULAR | Status: AC
  Administered 2024-09-09: 4 mg via INTRAVENOUS
  Filled 2024-09-09: qty 2

## 2024-09-09 MED ORDER — MORPHINE SULFATE (PF) 4 MG/ML IV SOLN
4.0000 mg | Freq: Once | INTRAVENOUS | Status: DC
  Filled 2024-09-09: qty 1

## 2024-09-09 NOTE — ED Notes (Signed)
 Pt jeans and undergarments cut and thrown away per pt request. Pt sweatshirt, socks and shoes taken off and given to sister. Pt placed on bedpan and urine obtained. Given medication for pain 10/10 and anxiety. Denies any other needs at this time. Call bell in reach.
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
## 2024-09-09 NOTE — ED Notes (Signed)
 Inpatient floor contacted, stated to bring pt up when we were ready.

## 2024-09-09 NOTE — ED Provider Notes (Signed)
 Lisa Mullins Provider Note   CSN: [id number] Arrival date & time: 09/09/24  8861     Patient presents with: Fall (/)   Lisa Mullins is a 63 y.o. female.   Patient is a 63 year old female who presents emergency department the chief complaint of pain to the left hip and left elbow following mechanical fall which occurred earlier today.  Patient notes that she tripped over a stick in her driveway falling onto her left side.  She denies striking her head or injure her neck or back.  She denies any use of anticoagulation and has no known history of bleeding disorders.  There was no associated syncope.  She has no pain to her neck or back at this time.  She denies any other long bone or joint pain.   Fall       Prior to Admission medications   Medication Sig Start Date End Date Taking? Authorizing Provider  acetaminophen  (TYLENOL ) 500 MG tablet Take 1,000 mg by mouth every 6 (six) hours as needed for mild pain.     [provider]  acetaminophen  (TYLENOL ) 500 MG tablet Take 1,000 mg by mouth every 6 (six) hours as needed for fever.    [provider]  acetaminophen -codeine  (TYLENOL  #3) 300-30 MG per tablet Take 1-2 tablets by mouth every 6 (six) hours as needed. 09/30/14   Armida Culver, PA-C  azithromycin  (ZITHROMAX  Z-PAK) 250 MG tablet 2 po day one, then 1 daily x 4 days 11/04/15   Suzette Pac, MD  HYDROcodone -acetaminophen  (NORCO/VICODIN) 5-325 MG per tablet Take 1 tablet by mouth every 4 (four) hours as needed. 09/30/14   Armida Culver, PA-C  ibuprofen  (ADVIL ,MOTRIN ) 800 MG tablet Take 1 tablet (800 mg total) by mouth 3 (three) times daily. 09/30/14   Armida Culver, PA-C  ibuprofen  (ADVIL ,MOTRIN ) 800 MG tablet Take 1 tablet (800 mg total) by mouth every 8 (eight) hours as needed for moderate pain. 11/04/15   Zammit, Joseph, MD  ondansetron  (ZOFRAN  ODT) 4 MG disintegrating tablet 4mg  ODT q4 hours prn nausea/vomit 11/04/15    Zammit, Joseph, MD  oseltamivir  (TAMIFLU ) 75 MG capsule Take 1 capsule (75 mg total) by mouth every 12 (twelve) hours. Patient not taking: Reported on 09/30/2014 11/18/13   Zammit, Joseph, MD  Phenylephrine-DM-GG-APAP (TYLENOL  COLD/FLU SEVERE PO) Take 2 tablets by mouth daily.    [provider]    Allergies: Codeine     Review of Systems  Musculoskeletal:        Left hip pain, left elbow pain  All other systems reviewed and are negative.   Updated Vital Signs BP (!) 142/70 (BP Location: Right Arm)   Pulse 84   Temp 98.6 F (37 C) (Oral)   Resp 19   Ht 5' 6 (1.676 m)   Wt 61.2 kg   SpO2 93%   BMI 21.79 kg/m   Physical Exam Vitals and nursing note reviewed.  Constitutional:      General: She is not in acute distress.    Appearance: Normal appearance. She is not ill-appearing.  HENT:     Head: Normocephalic and atraumatic.     Nose: Nose normal.     Mouth/Throat:     Mouth: Mucous membranes are moist.  Eyes:     Extraocular Movements: Extraocular movements intact.     Conjunctiva/sclera: Conjunctivae normal.     Pupils: Pupils are equal, round, and reactive to light.  Cardiovascular:     Rate and Rhythm:  Normal rate and regular rhythm.     Pulses: Normal pulses.     Heart sounds: Normal heart sounds. No murmur heard.    No gallop.  Pulmonary:     Effort: Pulmonary effort is normal. No respiratory distress.     Breath sounds: Normal breath sounds. No stridor. No wheezing, rhonchi or rales.  Chest:     Chest wall: No tenderness.  Abdominal:     General: Abdomen is flat. Bowel sounds are normal. There is no distension.     Palpations: Abdomen is soft.     Tenderness: There is no abdominal tenderness. There is no guarding.  Musculoskeletal:        General: Normal range of motion.     Cervical back: Normal range of motion and neck supple. No rigidity.     Comments: Tender to palpation noted over the left hip diffusely, nontender palpation of remainder  bilateral lower extremities, pelvis stable to bilateral compression, DP and PT pulses 2+ distally, sensation intact distally, tender to palpation noted over the left elbow, nontender palpation remainder bilateral upper extremities, radial pulses 2+ distally, sensation intact distally, left lower extremity is shortened and externally rotated, nontender palpation over thoracic or lumbar spine  Skin:    General: Skin is warm and dry.  Neurological:     General: No focal deficit present.     Mental Status: She is alert and oriented to person, place, and time. Mental status is at baseline.     Cranial Nerves: No cranial nerve deficit.     Sensory: No sensory deficit.     Motor: No weakness.     Coordination: Coordination normal.  Psychiatric:        Mood and Affect: Mood normal.        Behavior: Behavior normal.        Thought Content: Thought content normal.        Judgment: Judgment normal.     (all labs ordered are listed, but only abnormal results are displayed) Labs Reviewed  CBC WITH DIFFERENTIAL/PLATELET  COMPREHENSIVE METABOLIC PANEL WITH GFR  PROTIME-INR    EKG: None  Radiology: No results found.   Procedures   Medications Ordered in the ED  HYDROmorphone (DILAUDID) injection 0.5 mg (has no administration in time range)  ondansetron  (ZOFRAN ) injection 4 mg (4 mg Intravenous Given 09/09/24 1317)    Clinical Course as of 09/09/24 1511  Mon Sep 09, 2024  1240 DG HIP UNILAT WITH PELVIS 2-3 VIEWS LEFT [KF]  1415 DG HIP UNILAT WITH PELVIS 2-3 VIEWS LEFT [CR]    Clinical Course User Index [CR] Lisa Lonni BIRCH, PA-C [KF] Jurline Larraine LELON Seashore                                 Medical Decision Making Amount and/or Complexity of Data Reviewed Labs: ordered. Radiology: ordered. Decision-making details documented in ED Course.  Risk Prescription drug management. Decision regarding hospitalization.   This patient presents to the ED for concern of left hip  pain, left elbow pain differential diagnosis includes long bone or joint fracture, vertebral fracture, intracranial hemorrhage, rib fracture, intra-abdominal hemorrhage, retroperitoneal hemorrhage    Additional history obtained:  Additional history obtained from none External records from outside source obtained and reviewed including none   Lab Tests:  I Ordered, and personally interpreted labs.  The pertinent results include: No leukocytosis, no anemia, normal kidney function liver function, unremarkable  electrolytes, normal INR   Imaging Studies ordered:  I ordered imaging studies including chest x-ray, x-ray of left elbow, x-ray left hip I independently visualized and interpreted imaging which showed no acute cardiopulmonary process, no acute osseous injury of the left elbow, left femoral neck fracture I agree with the radiologist interpretation   Medicines ordered and prescription drug management:  I ordered medication including Dilaudid, Ativan, Zofran  for acute traumatic pain and anxiety Reevaluation of the patient after these medicines showed that the patient improved I have reviewed the patients home medicines and have made adjustments as needed   Problem List / ED Course:  Patient is doing well at this time and does remain stable.  Did discuss patient case with Dr. Onesimo with orthopedics regarding the left hip fracture and he will see the patient in consult.  He did request a CT scan of the left hip which has been ordered.  Patient's pain is still somewhat controlled at this point but have given additional medications.  Blood work has otherwise been unremarkable.  Patient did not strike her head during the fall and has no tenderness over cervical, thoracic, lumbar spine.  She has no other long bone or joint pain noted on exam.  She was nontender palpation of her chest wall and abdomen.  She was otherwise neurovascularly intact throughout.  Discussed patient case with Dr.  Dorinda with the hospital service who has excepted for admission.   Social Determinants of Health:  None        Final diagnoses:  None    ED Discharge Orders     None          Lisa Mullins 09/09/24 1513    Simon Lavonia SAILOR, MD 09/09/24 [phone]

## 2024-09-09 NOTE — ED Triage Notes (Signed)
 Pt arrived via POV c/o left hip and elbow pain and injury from a mechanical fall that occurred earlier today. Pt denies LOC, denies blood thinners and presents with limited mobility in her LLE.

## 2024-09-10 ENCOUNTER — Inpatient Hospital Stay (HOSPITAL_COMMUNITY)

## 2024-09-10 ENCOUNTER — Encounter (HOSPITAL_COMMUNITY): Payer: Self-pay | Admitting: Internal Medicine

## 2024-09-10 ENCOUNTER — Encounter (HOSPITAL_COMMUNITY): Admission: EM | Disposition: A | Payer: Self-pay | Source: Home / Self Care | Attending: Family Medicine

## 2024-09-10 ENCOUNTER — Inpatient Hospital Stay (HOSPITAL_COMMUNITY): Admitting: Certified Registered Nurse Anesthetist

## 2024-09-10 ENCOUNTER — Other Ambulatory Visit: Payer: Self-pay

## 2024-09-10 DIAGNOSIS — K219 Gastro-esophageal reflux disease without esophagitis: Secondary | ICD-10-CM | POA: Diagnosis present

## 2024-09-10 DIAGNOSIS — Z720 Tobacco use: Secondary | ICD-10-CM | POA: Diagnosis not present

## 2024-09-10 DIAGNOSIS — S72002A Fracture of unspecified part of neck of left femur, initial encounter for closed fracture: Secondary | ICD-10-CM | POA: Diagnosis not present

## 2024-09-10 HISTORY — PX: HIP PINNING,CANNULATED: SHX1758

## 2024-09-10 LAB — BASIC METABOLIC PANEL WITH GFR
Anion gap: 9 (ref 5–15)
BUN: 9 mg/dL (ref 8–23)
CO2: 27 mmol/L (ref 22–32)
Calcium: 8.5 mg/dL — ABNORMAL LOW (ref 8.9–10.3)
Chloride: 103 mmol/L (ref 98–111)
Creatinine, Ser: 0.53 mg/dL (ref 0.44–1.00)
GFR, Estimated: >60 mL/min (ref >60)
Glucose, Bld: 96 mg/dL (ref 70–99)
Potassium: 3.7 mmol/L (ref 3.5–5.1)
Sodium: 139 mmol/L (ref 135–145)

## 2024-09-10 LAB — CBC
HCT: 39.9 % (ref 36.0–46.0)
Hemoglobin: 13.3 g/dL (ref 12.0–15.0)
MCH: 29.3 pg (ref 26.0–34.0)
MCHC: 33.3 g/dL (ref 30.0–36.0)
MCV: 87.9 fL (ref 80.0–100.0)
Platelets: 285 K/uL (ref 150–400)
RBC: 4.54 MIL/uL (ref 3.87–5.11)
RDW: 12.9 % (ref 11.5–15.5)
WBC: 6.7 K/uL (ref 4.0–10.5)
nRBC: 0 % (ref 0.0–0.2)

## 2024-09-10 LAB — HIV ANTIBODY (ROUTINE TESTING W REFLEX): HIV Screen 4th Generation wRfx: NONREACTIVE

## 2024-09-10 SURGERY — FIXATION, FEMUR, NECK, PERCUTANEOUS, USING SCREW
Anesthesia: General | Site: Hip | Laterality: Left

## 2024-09-10 MED ORDER — PROPOFOL 500 MG/50ML IV EMUL
INTRAVENOUS | Status: AC
  Filled 2024-09-10: qty 50

## 2024-09-10 MED ORDER — EPHEDRINE SULFATE (PRESSORS) 25 MG/5ML IV SOSY
PREFILLED_SYRINGE | INTRAVENOUS | Status: DC | PRN
  Administered 2024-09-10: 10 mg via INTRAVENOUS

## 2024-09-10 MED ORDER — MIDAZOLAM HCL (PF) 2 MG/2ML IJ SOLN
INTRAMUSCULAR | Status: DC | PRN
  Administered 2024-09-10: 2 mg via INTRAVENOUS

## 2024-09-10 MED ORDER — HYDROMORPHONE HCL 1 MG/ML IJ SOLN
INTRAMUSCULAR | Status: AC
  Filled 2024-09-10: qty 0.5

## 2024-09-10 MED ORDER — TRANEXAMIC ACID-NACL 1000-0.7 MG/100ML-% IV SOLN
1000.0000 mg | Freq: Once | INTRAVENOUS | Status: AC
  Administered 2024-09-10: 1000 mg via INTRAVENOUS
  Filled 2024-09-10: qty 100

## 2024-09-10 MED ORDER — METOCLOPRAMIDE HCL 10 MG PO TABS: 5.0000 mg | ORAL_TABLET | Freq: Three times a day (TID) | ORAL | Status: DC | PRN

## 2024-09-10 MED ORDER — METHOCARBAMOL 1000 MG/10ML IJ SOLN: 500.0000 mg | Freq: Four times a day (QID) | INTRAMUSCULAR | Status: DC | PRN

## 2024-09-10 MED ORDER — DIPHENHYDRAMINE HCL 50 MG/ML IJ SOLN
INTRAMUSCULAR | Status: DC | PRN
  Administered 2024-09-10: 25 mg via INTRAVENOUS

## 2024-09-10 MED ORDER — ACETAMINOPHEN 500 MG PO TABS: 500.0000 mg | ORAL_TABLET | Freq: Once | ORAL | Status: DC

## 2024-09-10 MED ORDER — SCOPOLAMINE 1 MG/3DAYS TD PT72
1.0000 | MEDICATED_PATCH | Freq: Once | TRANSDERMAL | Status: DC
  Administered 2024-09-10: 1 mg via TRANSDERMAL

## 2024-09-10 MED ORDER — CHLORHEXIDINE GLUCONATE 0.12 % MT SOLN
15.0000 mL | Freq: Once | OROMUCOSAL | Status: AC
  Administered 2024-09-10: 15 mL via OROMUCOSAL

## 2024-09-10 MED ORDER — SCOPOLAMINE 1 MG/3DAYS TD PT72
MEDICATED_PATCH | TRANSDERMAL | Status: AC
  Filled 2024-09-10: qty 1

## 2024-09-10 MED ORDER — SODIUM CHLORIDE 0.9 % IV SOLN: INTRAVENOUS | Status: DC

## 2024-09-10 MED ORDER — DEXMEDETOMIDINE HCL IN NACL 80 MCG/20ML IV SOLN
INTRAVENOUS | Status: DC | PRN
  Administered 2024-09-10: 6 ug via INTRAVENOUS

## 2024-09-10 MED ORDER — MENTHOL 3 MG MT LOZG
1.0000 | LOZENGE | OROMUCOSAL | Status: DC | PRN
  Filled 2024-09-10: qty 9

## 2024-09-10 MED ORDER — FENTANYL CITRATE (PF) 250 MCG/5ML IJ SOLN
INTRAMUSCULAR | Status: AC
  Filled 2024-09-10: qty 5

## 2024-09-10 MED ORDER — METOCLOPRAMIDE HCL 5 MG/ML IJ SOLN: 5.0000 mg | Freq: Three times a day (TID) | INTRAMUSCULAR | Status: DC | PRN

## 2024-09-10 MED ORDER — LIDOCAINE 2% (20 MG/ML) 5 ML SYRINGE
INTRAMUSCULAR | Status: AC
  Filled 2024-09-10: qty 5

## 2024-09-10 MED ORDER — POLYETHYLENE GLYCOL 3350 17 G PO PACK
17.0000 g | PACK | Freq: Every day | ORAL | Status: DC | PRN
  Filled 2024-09-10: qty 1

## 2024-09-10 MED ORDER — PHENYLEPHRINE 80 MCG/ML (10ML) SYRINGE FOR IV PUSH (FOR BLOOD PRESSURE SUPPORT)
PREFILLED_SYRINGE | INTRAVENOUS | Status: AC
  Filled 2024-09-10: qty 10

## 2024-09-10 MED ORDER — PHENYLEPHRINE HCL-NACL 20-0.9 MG/250ML-% IV SOLN
INTRAVENOUS | Status: AC
  Filled 2024-09-10: qty 250

## 2024-09-10 MED ORDER — HYDROMORPHONE HCL 1 MG/ML IJ SOLN: 0.2500 mg | INTRAMUSCULAR | Status: DC | PRN

## 2024-09-10 MED ORDER — OXYCODONE HCL 5 MG/5ML PO SOLN: 5.0000 mg | Freq: Once | ORAL | Status: DC | PRN

## 2024-09-10 MED ORDER — MIDAZOLAM HCL 2 MG/2ML IJ SOLN
INTRAMUSCULAR | Status: AC
  Filled 2024-09-10: qty 2

## 2024-09-10 MED ORDER — ROCURONIUM BROMIDE 10 MG/ML (PF) SYRINGE
PREFILLED_SYRINGE | INTRAVENOUS | Status: DC | PRN
  Administered 2024-09-10: 60 mg via INTRAVENOUS
  Administered 2024-09-10: 30 mg via INTRAVENOUS

## 2024-09-10 MED ORDER — DIPHENHYDRAMINE HCL 50 MG/ML IJ SOLN
INTRAMUSCULAR | Status: AC
  Filled 2024-09-10: qty 1

## 2024-09-10 MED ORDER — HYDROCODONE-ACETAMINOPHEN 5-325 MG PO TABS
1.0000 | ORAL_TABLET | ORAL | Status: DC | PRN
  Administered 2024-09-10 – 2024-09-11 (×3): 2 via ORAL
  Filled 2024-09-10 (×3): qty 2

## 2024-09-10 MED ORDER — OXYCODONE HCL 5 MG PO TABS: 5.0000 mg | ORAL_TABLET | ORAL | Status: DC

## 2024-09-10 MED ORDER — LACTATED RINGERS IV SOLN: INTRAVENOUS | Status: DC

## 2024-09-10 MED ORDER — OXYCODONE HCL 5 MG PO TABS: 5.0000 mg | ORAL_TABLET | Freq: Once | ORAL | Status: DC | PRN

## 2024-09-10 MED ORDER — HYDROXYZINE HCL 25 MG PO TABS
25.0000 mg | ORAL_TABLET | Freq: Once | ORAL | Status: AC | PRN
  Administered 2024-09-10: 25 mg via ORAL
  Filled 2024-09-10: qty 1

## 2024-09-10 MED ORDER — PANTOPRAZOLE SODIUM 40 MG PO TBEC
40.0000 mg | DELAYED_RELEASE_TABLET | Freq: Every day | ORAL | Status: DC
  Administered 2024-09-10 – 2024-09-12 (×3): 40 mg via ORAL
  Filled 2024-09-10 (×3): qty 1

## 2024-09-10 MED ORDER — CEFAZOLIN SODIUM-DEXTROSE 2-4 GM/100ML-% IV SOLN
2.0000 g | Freq: Four times a day (QID) | INTRAVENOUS | Status: AC
  Administered 2024-09-10 (×2): 2 g via INTRAVENOUS
  Filled 2024-09-10 (×2): qty 100

## 2024-09-10 MED ORDER — 0.9 % SODIUM CHLORIDE (POUR BTL) OPTIME
TOPICAL | Status: DC | PRN
  Administered 2024-09-10: 1000 mL

## 2024-09-10 MED ORDER — HYDROMORPHONE HCL 1 MG/ML IJ SOLN
INTRAMUSCULAR | Status: DC | PRN
  Administered 2024-09-10 (×2): .5 mg via INTRAVENOUS

## 2024-09-10 MED ORDER — PHENOL 1.4 % MT LIQD
1.0000 | OROMUCOSAL | Status: DC | PRN
  Administered 2024-09-12: 1 via OROMUCOSAL
  Filled 2024-09-10: qty 177

## 2024-09-10 MED ORDER — LIDOCAINE 2% (20 MG/ML) 5 ML SYRINGE
INTRAMUSCULAR | Status: DC | PRN
  Administered 2024-09-10: 60 mg via INTRAVENOUS

## 2024-09-10 MED ORDER — CEFAZOLIN SODIUM-DEXTROSE 2-4 GM/100ML-% IV SOLN
2.0000 g | INTRAVENOUS | Status: AC
  Administered 2024-09-10: 2 g via INTRAVENOUS
  Filled 2024-09-10: qty 100

## 2024-09-10 MED ORDER — ACETAMINOPHEN 10 MG/ML IV SOLN
INTRAVENOUS | Status: DC | PRN
  Administered 2024-09-10: 1000 mg via INTRAVENOUS

## 2024-09-10 MED ORDER — ONDANSETRON HCL 4 MG/2ML IJ SOLN: 4.0000 mg | Freq: Once | INTRAMUSCULAR | Status: DC

## 2024-09-10 MED ORDER — ORAL CARE MOUTH RINSE: 15.0000 mL | Freq: Once | OROMUCOSAL | Status: AC

## 2024-09-10 MED ORDER — ALUM & MAG HYDROXIDE-SIMETH 200-200-20 MG/5ML PO SUSP: 30.0000 mL | ORAL | Status: DC | PRN

## 2024-09-10 MED ORDER — ACETAMINOPHEN 10 MG/ML IV SOLN
INTRAVENOUS | Status: AC
  Filled 2024-09-10: qty 100

## 2024-09-10 MED ORDER — BISACODYL 5 MG PO TBEC: 5.0000 mg | DELAYED_RELEASE_TABLET | Freq: Every day | ORAL | Status: DC | PRN

## 2024-09-10 MED ORDER — PHENYLEPHRINE HCL-NACL 20-0.9 MG/250ML-% IV SOLN
INTRAVENOUS | Status: DC | PRN
  Administered 2024-09-10: 25 ug/min via INTRAVENOUS

## 2024-09-10 MED ORDER — BUPIVACAINE-EPINEPHRINE (PF) 0.5% -1:200000 IJ SOLN
INTRAMUSCULAR | Status: DC | PRN
  Administered 2024-09-10: 30 mL via PERINEURAL

## 2024-09-10 MED ORDER — SUGAMMADEX SODIUM 200 MG/2ML IV SOLN
INTRAVENOUS | Status: DC | PRN
  Administered 2024-09-10: 200 mg via INTRAVENOUS

## 2024-09-10 MED ORDER — METHOCARBAMOL 500 MG PO TABS
500.0000 mg | ORAL_TABLET | Freq: Four times a day (QID) | ORAL | Status: DC | PRN
  Administered 2024-09-11: 500 mg via ORAL
  Filled 2024-09-10: qty 1

## 2024-09-10 MED ORDER — HYDROCODONE-ACETAMINOPHEN 7.5-325 MG PO TABS
1.0000 | ORAL_TABLET | ORAL | Status: DC | PRN
  Administered 2024-09-11 – 2024-09-12 (×4): 2 via ORAL
  Administered 2024-09-12: 1 via ORAL
  Administered 2024-09-12: 2 via ORAL
  Filled 2024-09-10 (×6): qty 2

## 2024-09-10 MED ORDER — EPHEDRINE 5 MG/ML INJ
INTRAVENOUS | Status: AC
  Filled 2024-09-10: qty 5

## 2024-09-10 MED ORDER — DEXAMETHASONE SOD PHOSPHATE PF 10 MG/ML IJ SOLN
INTRAMUSCULAR | Status: DC | PRN
  Administered 2024-09-10: 6 mg via INTRAVENOUS

## 2024-09-10 MED ORDER — ACETAMINOPHEN 500 MG PO TABS
500.0000 mg | ORAL_TABLET | Freq: Four times a day (QID) | ORAL | Status: AC
  Filled 2024-09-10 (×3): qty 1

## 2024-09-10 MED ORDER — ONDANSETRON HCL 4 MG/2ML IJ SOLN
INTRAMUSCULAR | Status: DC | PRN
  Administered 2024-09-10: 4 mg via INTRAVENOUS

## 2024-09-10 MED ORDER — BUPIVACAINE-EPINEPHRINE (PF) 0.5% -1:200000 IJ SOLN
INTRAMUSCULAR | Status: AC
  Filled 2024-09-10: qty 30

## 2024-09-10 MED ORDER — ROCURONIUM BROMIDE 10 MG/ML (PF) SYRINGE
PREFILLED_SYRINGE | INTRAVENOUS | Status: AC
  Filled 2024-09-10: qty 10

## 2024-09-10 MED ORDER — ACETAMINOPHEN 325 MG PO TABS: 325.0000 mg | ORAL_TABLET | Freq: Four times a day (QID) | ORAL | Status: DC | PRN

## 2024-09-10 MED ORDER — MUPIROCIN 2 % EX OINT: 1.0000 | TOPICAL_OINTMENT | Freq: Two times a day (BID) | CUTANEOUS | Status: DC

## 2024-09-10 MED ORDER — FENTANYL CITRATE (PF) 250 MCG/5ML IJ SOLN
INTRAMUSCULAR | Status: DC | PRN
  Administered 2024-09-10 (×2): 50 ug via INTRAVENOUS
  Administered 2024-09-10: 100 ug via INTRAVENOUS

## 2024-09-10 MED ORDER — KETOROLAC TROMETHAMINE 30 MG/ML IJ SOLN
15.0000 mg | Freq: Once | INTRAMUSCULAR | Status: AC
  Administered 2024-09-10: 15 mg via INTRAVENOUS
  Filled 2024-09-10: qty 1

## 2024-09-10 MED ORDER — PROPOFOL 500 MG/50ML IV EMUL
INTRAVENOUS | Status: DC | PRN
  Administered 2024-09-10: 80 mg via INTRAVENOUS

## 2024-09-10 MED ORDER — DOCUSATE SODIUM 100 MG PO CAPS
100.0000 mg | ORAL_CAPSULE | Freq: Two times a day (BID) | ORAL | Status: DC
  Administered 2024-09-10 – 2024-09-12 (×5): 100 mg via ORAL
  Filled 2024-09-10 (×5): qty 1

## 2024-09-10 MED ORDER — ONDANSETRON HCL 4 MG/2ML IJ SOLN
INTRAMUSCULAR | Status: AC
  Filled 2024-09-10: qty 2

## 2024-09-10 MED ORDER — PHENYLEPHRINE 80 MCG/ML (10ML) SYRINGE FOR IV PUSH (FOR BLOOD PRESSURE SUPPORT)
PREFILLED_SYRINGE | INTRAVENOUS | Status: DC | PRN
  Administered 2024-09-10: 160 ug via INTRAVENOUS
  Administered 2024-09-10 (×2): 80 ug via INTRAVENOUS
  Administered 2024-09-10 (×2): 160 ug via INTRAVENOUS
  Administered 2024-09-10: 80 ug via INTRAVENOUS

## 2024-09-10 MED ORDER — ASPIRIN 325 MG PO TBEC
325.0000 mg | DELAYED_RELEASE_TABLET | Freq: Every day | ORAL | Status: DC
  Administered 2024-09-11 – 2024-09-12 (×2): 325 mg via ORAL
  Filled 2024-09-10 (×2): qty 1

## 2024-09-10 SURGICAL SUPPLY — 50 items
BENZOIN TINCTURE PRP APPL 2/3 (GAUZE/BANDAGES/DRESSINGS) IMPLANT
BIT DRILL 4.8X300 (BIT) ×2 IMPLANT
BLADE HEX COATED 2.75 (ELECTRODE) ×2 IMPLANT
BLADE SURG SZ10 CARB STEEL (BLADE) ×4 IMPLANT
BNDG GAUZE DERMACEA FLUFF 4 (GAUZE/BANDAGES/DRESSINGS) ×2 IMPLANT
CATH FOLEY LF 14FR (CATHETERS) IMPLANT
CHLORAPREP W/TINT 26 (MISCELLANEOUS) ×2 IMPLANT
CLOTH BEACON ORANGE TIMEOUT ST (SAFETY) ×2 IMPLANT
COUNTER NDL MAGNETIC 40 RED (SET/KITS/TRAYS/PACK) ×2 IMPLANT
COVER LIGHT HANDLE STERIS (MISCELLANEOUS) ×4 IMPLANT
COVER PERINEAL POST (MISCELLANEOUS) ×2 IMPLANT
DRAPE STERI IOBAN 125X83 (DRAPES) ×2 IMPLANT
DRESSING MEPILEX BORDER 6X8 (GAUZE/BANDAGES/DRESSINGS) ×2 IMPLANT
DRSG MEPILEX SACRM 8.7X9.8 (GAUZE/BANDAGES/DRESSINGS) ×2 IMPLANT
GLOVE BIO SURGEON STRL SZ 6.5 (GLOVE) IMPLANT
GLOVE BIOGEL PI IND STRL 7.0 (GLOVE) ×4 IMPLANT
GLOVE BIOGEL PI IND STRL 8.5 (GLOVE) ×2 IMPLANT
GLOVE SKINSENSE STRL SZ8.0 LF (GLOVE) ×4 IMPLANT
GOWN STRL REUS W/TWL LRG LVL3 (GOWN DISPOSABLE) ×2 IMPLANT
GOWN STRL REUS W/TWL XL LVL3 (GOWN DISPOSABLE) ×2 IMPLANT
INST SET MAJOR BONE (KITS) ×2 IMPLANT
KIT TURNOVER CYSTO (KITS) ×2 IMPLANT
MANIFOLD NEPTUNE II (INSTRUMENTS) ×2 IMPLANT
MARKER SKIN DUAL TIP RULER LAB (MISCELLANEOUS) ×2 IMPLANT
NDL HYPO 21X1.5 SAFETY (NEEDLE) ×2 IMPLANT
NDL SPNL 18GX3.5 QUINCKE PK (NEEDLE) ×2 IMPLANT
PACK SRG BSC III STRL LF ECLPS (CUSTOM PROCEDURE TRAY) ×2 IMPLANT
PAD ABD 5X9 TENDERSORB (GAUZE/BANDAGES/DRESSINGS) ×2 IMPLANT
PENCIL SMOKE EVACUATOR COATED (MISCELLANEOUS) ×2 IMPLANT
PIN GUIDE DRILL TIP 2.8X300 (DRILL) ×6 IMPLANT
POSITIONER HEAD 8X9X4 ADT (SOFTGOODS) ×2 IMPLANT
SCREW CANN 6.5X85 TMAX FULL TH (Screw) IMPLANT
SCREW CANN FULL THD 6.5X100 (Screw) IMPLANT
SCREW CANN FULL THD 6.5X95 (Screw) IMPLANT
SCREW FULLY THREADED 6.5X80 (Screw) IMPLANT
SET BASIN LINEN APH (SET/KITS/TRAYS/PACK) ×2 IMPLANT
SOLN 0.9% NACL POUR BTL 1000ML (IV SOLUTION) ×2 IMPLANT
SPIKE FLUID TRANSFER (MISCELLANEOUS) ×4 IMPLANT
SPONGE T-LAP 18X18 ~~LOC~~+RFID (SPONGE) ×4 IMPLANT
STRIP CLOSURE SKIN 1/2X4 (GAUZE/BANDAGES/DRESSINGS) IMPLANT
SUT BRALON NAB BRD #1 30IN (SUTURE) ×2 IMPLANT
SUT MNCRL 0 VIOLET CTX 36 (SUTURE) ×2 IMPLANT
SUT MON AB 2-0 CT1 36 (SUTURE) IMPLANT
SUT VIC AB 1 CT1 27XBRD ANTBC (SUTURE) IMPLANT
SYR 30ML LL (SYRINGE) ×2 IMPLANT
SYR BULB IRRIG 60ML STRL (SYRINGE) ×4 IMPLANT
TRAY FOLEY W/BAG SLVR 16FR ST (SET/KITS/TRAYS/PACK) ×2 IMPLANT
WASHER CANN FLAT 8 (Washer) IMPLANT
WASHER FLAT 6.5MM (Washer) IMPLANT
YANKAUER SUCT BULB TIP 10FT TU (MISCELLANEOUS) ×2 IMPLANT

## 2024-09-10 NOTE — Transfer of Care (Signed)
 Immediate Anesthesia Transfer of Care Note  Patient: Lisa Mullins  Procedure(s) Performed: FIXATION, FEMUR, NECK, PERCUTANEOUS, USING SCREW (Left: Hip)  Patient Location: PACU  Anesthesia Type:General  Level of Consciousness: drowsy and patient cooperative  Airway & Oxygen Therapy: Patient Spontanous Breathing and Patient connected to face mask oxygen  Post-op Assessment: Report given to RN and Post -op Vital signs reviewed and stable  Post vital signs: Reviewed and stable  Last Vitals:  Vitals Value Taken Time  BP 88/52 09/10/24 13:05  Temp 37.1 C 09/10/24 13:05  Pulse 73 09/10/24 13:10  Resp 11 09/10/24 13:10  SpO2 100 % 09/10/24 13:10  Vitals shown include unfiled device data.  Last Pain:  Vitals:   09/10/24 0938  TempSrc:   PainSc: 10-Worst pain ever      Patients Stated Pain Goal: 8 (09/10/24 9061)  Complications: No notable events documented.

## 2024-09-10 NOTE — Progress Notes (Signed)
 Provider requested VS to determine medication. VS taken and recorded. Pt flagged MEWS yellow. Pt not in a resting state as pt was suffering severe anxiety r/t her injury and impending surgery. VS retaken after medication given for anxiety. MEWS green.

## 2024-09-10 NOTE — Brief Op Note (Signed)
 09/09/2024 - 09/10/2024  12:46 PM  PATIENT:  Lisa Mullins  63 y.o. female  PRE-OPERATIVE DIAGNOSIS:  Nondisplaced left femoral neck fracture  POST-OPERATIVE DIAGNOSIS:  Nondisplaced left femoral neck fracture  PROCEDURE:  Procedure(s): FIXATION, FEMUR, NECK, PERCUTANEOUS, USING SCREW (Left)  SURGEON:  Surgeons and Role:    DEWAINE Margrette Taft FORBES, MD - Primary  PHYSICIAN ASSISTANT:   ASSISTANTS: wendy kendrick    ANESTHESIA:   general  EBL:  100 mL   BLOOD ADMINISTERED:none  DRAINS: none   LOCAL MEDICATIONS USED:  MARCAINE     SPECIMEN:  No Specimen  DISPOSITION OF SPECIMEN:  N/A  COUNTS:  YES  TOURNIQUET:  * No tourniquets in log *  DICTATION: .Dragon Dictation  PLAN OF CARE: Admit to inpatient   PATIENT DISPOSITION:  PACU - hemodynamically stable.   Delay start of Pharmacological VTE agent (>24hrs) due to surgical blood loss or risk of bleeding: not applicable

## 2024-09-10 NOTE — H&P (View-Only) (Signed)
 ORTHOPAEDIC CONSULTATION  REQUESTING PHYSICIAN: Pearlean Manus, MD  ASSESSMENT AND PLAN: 63 y.o. female with the following: Left Hip Valgus impacted femoral neck fracture  This patient requires inpatient admission to the hospitalist, to include preoperative clearance and perioperative medical management  - Weight Bearing Status/Activity: NWB Left lower extremity  - Additional recommended labs/tests: Preop Labs: CBC, BMP, PT/INR, Chest XR, and EKG  -VTE Prophylaxis: Please hold prior to OR; to resume POD#1 at the discretion of the primary team  - Pain control: Recommend PO pain medications PRN; judicious use of narcotics  - Follow-up plan: F/u 10-14 days postop  -Procedures: Plan for OR once patient has been medically optimized  Plan for Left Hip CRPP     Chief Complaint: Left hip pain  HPI: Lisa Mullins is a 63 y.o. female who presented to the ED for evaluation after sustaining a mechanical fall.  She reports that she was in her driveway when she tripped and fell.  She is complaining of left hip and left elbow pain.  She did not hit her head.  She did not use a blood thinner.  She was able to ambulate, get to her car, and drive proceeded to the emergency department.  However, she was unable to get out of her car.  She is otherwise healthy.   Past Medical History:  Diagnosis Date   Benign tumor of breast    Past Surgical History:  Procedure Laterality Date   BREAST SURGERY     TUMOR REMOVAL     from left breast    Social History   Socioeconomic History   Marital status: Divorced    Spouse name: Not on file   Number of children: Not on file   Years of education: Not on file   Highest education level: Not on file  Occupational History   Not on file  Tobacco Use   Smoking status: Former    Current packs/day: 0.50    Types: Cigarettes    Passive exposure: Past   Smokeless tobacco: Not on file  Vaping Use   Vaping status: Never Used  Substance and Sexual  Activity   Alcohol use: No   Drug use: No   Sexual activity: Not on file  Other Topics Concern   Not on file  Social History Narrative   ** Merged History Encounter **       Social Drivers of Health   Financial Resource Strain: Not on file  Food Insecurity: No Food Insecurity (09/10/2024)   Hunger Vital Sign    Worried About Running Out of Food in the Last Year: Never true    Ran Out of Food in the Last Year: Never true  Transportation Needs: No Transportation Needs (09/10/2024)   PRAPARE - Administrator, Civil Service (Medical): No    Lack of Transportation (Non-Medical): No  Physical Activity: Not on file  Stress: Not on file  Social Connections: Moderately Isolated (09/10/2024)   Social Connection and Isolation Panel    Frequency of Communication with Friends and Family: Twice a week    Frequency of Social Gatherings with Friends and Family: Twice a week    Attends Religious Services: 1 to 4 times per year    Active Member of [employer] or Organizations: No    Attends Banker Meetings: Never    Marital Status: Widowed   Family History  Problem Relation Age of Onset   Stroke Other    Allergies  Allergen Reactions  Codeine     Prior to Admission medications   Medication Sig Start Date End Date Taking? Authorizing Provider  acetaminophen  (TYLENOL ) 500 MG tablet Take 1,000 mg by mouth every 6 (six) hours as needed for mild pain.    Yes [provider]   CT 3D RECON AT SCANNER Result Date: 09/10/2024 CLINICAL DATA:  Left hip pain after fall. EXAM: CT OF THE LEFT HIP WITHOUT CONTRAST TECHNIQUE: Multidetector CT imaging of the left hip was performed according to the standard protocol. Multiplanar CT image reconstructions were also generated. RADIATION DOSE REDUCTION: This exam was performed according to the departmental dose-optimization program which includes automated exposure control, adjustment of the mA and/or kV according to patient size and/or  use of iterative reconstruction technique. COMPARISON:  Left hip radiographs dated 09/09/2024. FINDINGS: Bones/Joint/Cartilage Mildly impacted fracture of the left femoral neck extending through the medial and lateral cortices. No significant angulation. The left femoral head is seated within the acetabulum. Mild osteoarthritis of the left hip with joint space narrowing. The left sacroiliac joint and pubic symphysis are anatomically aligned. Ligaments Ligaments are suboptimally evaluated by CT. Muscles and Tendons Muscles are within normal limits. Soft tissue No fluid collection. IMPRESSION: 1. Acute mildly impacted fracture of the left femoral neck. 2. Mild osteoarthritis of the left hip. Electronically Signed   By: Harrietta Sherry M.D.   On: 09/10/2024 06:58   CT Hip Left Wo Contrast Result Date: 09/09/2024 CLINICAL DATA:  Left hip pain after fall. EXAM: CT OF THE LEFT HIP WITHOUT CONTRAST TECHNIQUE: Multidetector CT imaging of the left hip was performed according to the standard protocol. Multiplanar CT image reconstructions were also generated. RADIATION DOSE REDUCTION: This exam was performed according to the departmental dose-optimization program which includes automated exposure control, adjustment of the mA and/or kV according to patient size and/or use of iterative reconstruction technique. COMPARISON:  Left hip radiographs dated 09/09/2024. FINDINGS: Bones/Joint/Cartilage Mildly impacted fracture of the left femoral neck extending through the medial and lateral cortices. No significant angulation. The left femoral head is seated within the acetabulum. Mild osteoarthritis of the left hip with joint space narrowing. The left sacroiliac joint and pubic symphysis are anatomically aligned. Ligaments Ligaments are suboptimally evaluated by CT. Muscles and Tendons Muscles are within normal limits. Soft tissue No fluid collection. IMPRESSION: 1. Acute mildly impacted fracture of the left femoral neck. 2. Mild  osteoarthritis of the left hip. Electronically Signed   By: Harrietta Sherry M.D.   On: 09/09/2024 16:53   DG HIP UNILAT WITH PELVIS 2-3 VIEWS LEFT Result Date: 09/09/2024 CLINICAL DATA:  Fall, pain. EXAM: DG HIP (WITH OR WITHOUT PELVIS) 2-3V LEFT COMPARISON:  None Available. FINDINGS: Minimally impacted left femoral neck fracture. No angulation. No additional evidence of an acute fracture. IMPRESSION: Minimally impacted left femoral neck fracture. Electronically Signed   By: Newell Eke M.D.   On: 09/09/2024 14:03   DG Chest Port 1 View Result Date: 09/09/2024 CLINICAL DATA:  Preop hip fracture. EXAM: PORTABLE CHEST 1 VIEW COMPARISON:  11/04/2015. FINDINGS: Trachea is midline. Heart is at the upper limits of normal in size. Thoracic aorta is calcified. Minimal streaky scarring or atelectasis in the lung bases. Biapical pleural thickening. No pleural fluid. Osseous structures appear grossly intact. IMPRESSION: Minimal basilar atelectasis or scarring. Electronically Signed   By: Newell Eke M.D.   On: 09/09/2024 14:03   DG Elbow Complete Left Result Date: 09/09/2024 CLINICAL DATA:  Fall, pain. EXAM: LEFT ELBOW - COMPLETE 3+ VIEW COMPARISON:  None Available. FINDINGS: No fracture or dislocation. IMPRESSION: No fracture or dislocation. Electronically Signed   By: Newell Eke M.D.   On: 09/09/2024 14:02   Family History Reviewed and non-contributory, no pertinent history of problems with bleeding or anesthesia    Review of Systems No fevers or chills No numbness or tingling No chest pain No shortness of breath No bowel or bladder dysfunction No GI distress No headaches    OBJECTIVE  Vitals:Patient Vitals for the past 8 hrs:  BP Temp Temp src Pulse Resp SpO2  09/10/24 0722 (!) 114/58 98.1 F (36.7 C) Oral 80 18 95 %  09/10/24 0415 111/65 98.6 F (37 C) Oral 85 20 95 %  09/10/24 0226 114/64 98.9 F (37.2 C) Oral (!) 114 (!) 22 96 %   General: Alert, no acute  distress Cardiovascular: Extremities are warm Respiratory: No cyanosis, no use of accessory musculature Skin: No lesions in the area of chief complaint  Neurologic: Sensation intact distally  Psychiatric: Patient is competent for consent with normal mood and affect Lymphatic: No swelling obvious and reported other than the area involved in the exam below Extremities  LLE: Extremity held in a fixed position.  ROM deferred due to known fracture.  Sensation is intact distally in the sural, saphenous, DP, SP, and plantar nerve distribution. 2+ DP pulse.  Toes are WWP.  Active motion intact in the TA/EHL/GS. RLE: Sensation is intact distally in the sural, saphenous, DP, SP, and plantar nerve distribution. 2+ DP pulse.  Toes are WWP.  Active motion intact in the TA/EHL/GS. Tolerates gentle ROM of the hip.  No pain with axial loading.     Test Results Imaging XR of the Left demonstrates a Valgus impacted femoral neck fracture.  Labs cbc Recent Labs    09/09/24 1255 09/10/24 0514  WBC 7.6 6.7  HGB 14.1 13.3  HCT 43.2 39.9  PLT 319 285     Labs coag Recent Labs    09/09/24 1255  INR 0.9    Recent Labs    09/09/24 1255 09/10/24 0514  NA 138 139  K 3.5 3.7  CL 103 103  CO2 23 27  GLUCOSE 121* 96  BUN 13 9  CREATININE 0.54 0.53  CALCIUM 9.0 8.5*

## 2024-09-10 NOTE — Discharge Instructions (Signed)
  Providers Accepting New Patients in [place], [place]    [facility] [address]  [place], [place] [address] [phone] Accepts most insurances  [facility] [address] [place], [place] [address] [phone] Accepts most insurances  [facility] [address] [place], [place] [address]  [phone] Must meet requirements  [facility] [address] [place], [place] [address] [phone] Accepts most insurances  [facility] [address]  [place], [place] [address] [phone] Accepts most insurances  [facility] [address]   [place], [place]   [phone] Accepts most insurances  [facility])  [phone] [address]  [place], [place] [address] [phone] Accepts most insurances     [facility] [address]  [place], [place] [address] [phone] Accepts most insurances  [facility] Department [address] [place], [place] [address] [phone] option 1 Accepts Medicaid and [facility] [address]  [place], [place] [address] [phone] Accepts most insurances  Benita Outhouse, MD [address] [place], [place] [address] [phone] Accepts most insurances  [facility] at [facility] [address]  [place], [place] [address] [phone] Accepts most insurances  [facility] [place] [facility] [phone] [address] [place], [place] [address] [phone] Accepts most insurances  [place] [place] [address] [place], [place] [address] [phone]  Accepts most insurances

## 2024-09-10 NOTE — Anesthesia Preprocedure Evaluation (Signed)
 Anesthesia Evaluation  Patient identified by MRN, date of birth, ID band Patient awake    Reviewed: Allergy & Precautions, H&P , NPO status , Patient's Chart, lab work & pertinent test results  Airway Mallampati: II  TM Distance: >3 FB Neck ROM: Full    Dental  (+) Edentulous Lower, Edentulous Upper   Pulmonary neg pulmonary ROS, former smoker   Pulmonary exam normal breath sounds clear to auscultation       Cardiovascular negative cardio ROS Normal cardiovascular exam Rhythm:Regular Rate:Normal     Neuro/Psych negative neurological ROS  negative psych ROS   GI/Hepatic negative GI ROS, Neg liver ROS,,,  Endo/Other  negative endocrine ROS    Renal/GU negative Renal ROS  negative genitourinary   Musculoskeletal negative musculoskeletal ROS (+)    Abdominal   Peds negative pediatric ROS (+)  Hematology negative hematology ROS (+)   Anesthesia Other Findings Hx cocaine abuse  Reproductive/Obstetrics negative OB ROS                              Anesthesia Physical Anesthesia Plan  ASA: 2  Anesthesia Plan: General   Post-op Pain Management:    Induction: Intravenous  PONV Risk Score and Plan:   Airway Management Planned: Oral ETT  Additional Equipment:   Intra-op Plan:   Post-operative Plan: Extubation in OR  Informed Consent: I have reviewed the patients History and Physical, chart, labs and discussed the procedure including the risks, benefits and alternatives for the proposed anesthesia with the patient or authorized representative who has indicated his/her understanding and acceptance.       Plan Discussed with: CRNA  Anesthesia Plan Comments: (Refused spinal)        Anesthesia Quick Evaluation

## 2024-09-10 NOTE — Op Note (Signed)
 OPERATIVE REPORT for cannulated screws   Orthopaedic Surgery Operative Note (CSN: [id number])  Lisa Mullins  1960/11/12 Date of Surgery: 09/09/2024 - 09/10/2024   09/10/2024  12:46 PM  PATIENT:  Lisa Mullins  63 y.o. female  PRE-OPERATIVE DIAGNOSIS:  Nondisplaced left femoral neck fracture  POST-OPERATIVE DIAGNOSIS:  Nondisplaced left femoral neck fracture  PROCEDURE:  Procedure(s): FIXATION, FEMUR, NECK, PERCUTANEOUS, USING SCREW (Left)  SURGEON:  Surgeons and Role:    DEWAINE Margrette Taft FORBES, MD - Primary  PHYSICIAN ASSISTANT:   ASSISTANTS: wendy kendrick    ANESTHESIA:   general  EBL:  100 mL   BLOOD ADMINISTERED:none  DRAINS: none   LOCAL MEDICATIONS USED:  MARCAINE     SPECIMEN:  No Specimen  DISPOSITION OF SPECIMEN:  N/A  COUNTS:  YES  TOURNIQUET:  * No tourniquets in log *  DICTATION: .Dragon Dictation  PLAN OF CARE: Admit to inpatient   PATIENT DISPOSITION:  PACU - hemodynamically stable.   Delay start of Pharmacological VTE agent (>24hrs) due to surgical blood loss or risk of bleeding: not applicable  Implants: Implant Name Type Inv. Item Serial No. Manufacturer Lot No. LRB No. Used Action  SCREW CANN 6.5X85 TMAX FULL TH - [id number] Screw SCREW CANN 6.5X85 TMAX FULL TH  ZIMMER [employer]ON(ORTH,TRAU,BIO,SG) STERILE ON SET Left 1 Implanted  SCREW FULLY THREADED 6.5X80 - [id number] Screw SCREW FULLY THREADED 6.5X80  ZIMMER RECON(ORTH,TRAU,BIO,SG) STERILE ON SET Left 1 Implanted  SCREW CANN FULL THD 6.5X95 - [id number] Screw SCREW CANN FULL THD 6.5X95  ZIMMER RECON(ORTH,TRAU,BIO,SG) STERILE ON SET Left 1 Implanted  WASHER FLAT 6.5MM - [id number] Washer WASHER FLAT 6.5MM  ZIMMER RECON(ORTH,TRAU,BIO,SG) STERILE ON SET Left 3 Implanted  WASHER CANN FLAT 8 - [id number] Washer WASHER CANN FLAT 8  ZIMMER RECON(ORTH,TRAU,BIO,SG) STERILE ON SET Left 1 Implanted    Transexamic  acid was given  yes   Lisa Mullins is a 63 y.o. female who fractured her left hip as she  fell slipping on a stick.  Benefits and risks of operative management were discussed prior to surgery including a brief discussion on the benefits and risk of total hip arthroplasty in this setting.  However after careful review of the imaging studies the fracture was valgus impacted and amenable to pinning  Informed consent was obtained to proceed with closed reduction percutaneous pinning with a 15% chance of avascular necrosis or nonunion requiring total hip arthroplasty    Findings at surgery non displaced valgus impacted left femoral neck fracture   Details of procedure  The patient was identified in the preoperative area, the chart was reviewed. The x-rays were reviewed. The surgical site was confirmed and marked.  The patient was then taken to the operating room where she was given general anesthetic A Foley catheter was placed. The patient was placed on the fracture table with a padded perineal post.   The injured leg was placed in a traction device and the well leg was abducted and externally rotated and flexed  Preliminary timeout to confirm x-ray gown and badges was performed and the C-arm was brought in and radiographs were taken in multiple planes confirming the fracture to be reduced and adequate radiographs could be obtained.  The fracture was valgus impacted and no reduction was required  The leg was then prepped and draped appropriately.  Timeout was completed, and the surgical site was confirmed. Implants had been checked prior to bringing the patient back to the room.  The incision was made starting  just below the greater trochanter extended distally; subcutaneous tissue was divided in line with the skin incision.  The fascia was divided and the vastus lateralis fascia released from the trochanteric ridge with an L-shaped pattern followed by subperiosteal dissection exposing the proximal femur  A single drill guide and pin were placed in the center of the femoral neck and head  on both the AP and lateral view at the inferior portion to control varus collapse  A multi hole pin guide was placed on the bone and two threaded guidewires were placed in the superior portion of the femoral head and neck. These were adjusted until there were in the appropriate position.  Each pin was independently measured followed by drilling of the near cortex and insertion of appropriate screws with washers.  Each screw was checked with AP and lateral x-rays using oblique x-rays where needed.  The wound was irrigated with copious amounts of saline and then closure was performed  Closure was performed with 1 Vicryl, #1 Bralon, 0 Monocryl and 2-0 Monocryl    Post-operative plan:  The patient will be FWB on the operative extremity Discharge home from the PACU once they have recovered DVT prophylaxis Aspirin 81 mg twice daily for 6 weeks.    Pain control with PRN pain medication preferring oral medicines.   Follow up plan will be scheduled in approximately 15 days on 17 December for x-ray and wound check  The postoperative plan is for immediate weightbearing as tolerated  Radiographs at 2, 6 and 12 weeks.  DVT prophylaxis 1 mg aspirin twice a day for 6 weeks CPT code 72764

## 2024-09-10 NOTE — Plan of Care (Signed)

## 2024-09-10 NOTE — Interval H&P Note (Signed)
 History and Physical Interval Note:  09/10/2024 10:47 AM  Lisa Mullins  has presented today for surgery, with the diagnosis of Nondisplaced left femoral neck fracture.  The various methods of treatment have been discussed with the patient and family. After consideration of risks, benefits and other options for treatment, the patient has consented to  Procedure(s) with comments: FIXATION, FEMUR, NECK, PERCUTANEOUS, USING SCREW (Left) - Left hip CRPP as a surgical intervention.  The patient's history has been reviewed, patient examined, no change in status, stable for surgery.  I have reviewed the patient's chart and labs.  Questions were answered to the patient's satisfaction.     [name]

## 2024-09-10 NOTE — TOC CM/SW Note (Signed)
 Transition of Care [facility]) - Inpatient Brief Assessment   Patient Details  Name: Lisa Mullins MRN: [id number] Date of Birth: 1960-12-11  Transition of Care [facility]) CM/SW Contact:    Lucie Lunger, LCSWA Phone Number: 09/10/2024, 9:11 AM   Clinical Narrative: Transition of Care Department [facility]) has reviewed patient and no TOC needs have been identified at this time. We will continue to monitor patient advancement through interdiciplinary progression rounds. If new patient transition needs arise, please place a TOC consult.  Transition of Care Asessment: Insurance and Status: Insurance coverage has been reviewed Patient has primary care physician: No (PCP added to AVS) Home environment has been reviewed: From home Prior level of function:: Independent Prior/Current Home Services: No current home services Social Drivers of Health Review: SDOH reviewed no interventions necessary Readmission risk has been reviewed: Yes Transition of care needs: transition of care needs identified, TOC will continue to follow

## 2024-09-10 NOTE — Anesthesia Procedure Notes (Signed)
 Procedure Name: Intubation Date/Time: 09/10/2024 11:03 AM  Performed by: Para Jerelene CROME, CRNAPre-anesthesia Checklist: Patient identified, Emergency Drugs available, Suction available and Patient being monitored Patient Re-evaluated:Patient Re-evaluated prior to induction Oxygen Delivery Method: Circle system utilized Preoxygenation: Pre-oxygenation with 100% oxygen Induction Type: IV induction Ventilation: Mask ventilation without difficulty Laryngoscope Size: Mac and 4 Grade View: Grade I Tube type: Oral Tube size: 7.0 mm Number of attempts: 1 Airway Equipment and Method: Stylet Placement Confirmation: ETT inserted through vocal cords under direct vision, positive ETCO2, CO2 detector and breath sounds checked- equal and bilateral Secured at: 21 (OETT secured 21 cm at lower lip.) cm Tube secured with: Tape Dental Injury: Teeth and Oropharynx as per pre-operative assessment  Comments: Atraumatic intuabation x 1. Lips remain in preoperative condition. Patient is edentulous.

## 2024-09-10 NOTE — Progress Notes (Signed)
 PROGRESS NOTE  Lisa Mullins, is a 63 y.o. female, DOB - 04-14-1961, FMW:[id number]  Admit date - 09/09/2024   Admitting Physician Drue ONEIDA Potter, MD  Outpatient Primary MD for the patient is Patient, No Pcp Per  LOS - 1  Chief Complaint  Patient presents with   Fall           Brief Narrative:  63 year old with nicotine abuse and GERD  who is otherwise healthy admitted on 09/09/2024 with left hip fracture - Underwent FIXATION, FEMUR, NECK, PERCUTANEOUS, USING SCREW (Left) on 09/10/2024 for nondisplaced left femoral neck fracture    -Assessment and Plan: 1) nondisplaced left femoral neck fracture--status post ORIF on 09/10/2024 -Post-operative plan:  The patient will be FWB on the operative extremity Discharge home from the PACU once they have recovered DVT prophylaxis  6 weeks.    Pain control with PRN pain medication preferring oral medicines.   Follow up plan will be scheduled in approximately 15 days on 17 December for x-ray and wound check  -- Further management per orthopedic team - PT eval pending   2) nicotine dependence-patient uses vaping - Abstinence from nicotine advised  3)GERD--continue Protonix  Status is: Inpatient   Disposition: The patient is from: Home              Anticipated d/c is to: Home              Anticipated d/c date is: 1 day              Patient currently is not medically stable to d/c. Barriers: Not Clinically Stable-   Code Status :  -  Code Status: Full Code    Family Communication:   NA (patient is alert, awake and coherent)   Discussed with patient Sister Darlene at bedside  DVT Prophylaxis  :   - SCDs  SCDs Start: 09/10/24 1443 Place TED hose Start: 09/10/24 1443 SCDs Start: 09/09/24 1541   Lab Results  Component Value Date   PLT 285 09/10/2024    Inpatient Medications  Scheduled Meds:  acetaminophen   500 mg Oral Q6H   [START ON 09/11/2024] aspirin EC  325 mg Oral Q breakfast   docusate sodium  100 mg Oral BID    pantoprazole  40 mg Oral Daily   Continuous Infusions:  sodium chloride       ceFAZolin (ANCEF) IV 2 g (09/10/24 1813)   PRN Meds:.[START ON 09/11/2024] acetaminophen , alum & mag hydroxide-simeth, bisacodyl, HYDROcodone -acetaminophen , HYDROcodone -acetaminophen , menthol **OR** phenol, methocarbamol **OR** methocarbamol (ROBAXIN) injection, metoCLOPramide **OR** metoCLOPramide (REGLAN) injection, morphine injection, ondansetron  (ZOFRAN ) IV, polyethylene glycol   Anti-infectives (From admission, onward)    Start     Dose/Rate Route Frequency Ordered Stop   09/10/24 1700  ceFAZolin (ANCEF) IVPB 2g/100 mL premix        2 g 200 mL/hr over 30 Minutes Intravenous Every 6 hours 09/10/24 1442 09/11/24 0459   09/10/24 0945  ceFAZolin (ANCEF) IVPB 2g/100 mL premix        2 g 200 mL/hr over 30 Minutes Intravenous On call to O.R. 09/10/24 0850 09/10/24 1138         Subjective: Lisa Mullins today has no fevers, no emesis,  No chest pain,    Discussed with patient Sister Darlene at bedside - Resting comfortably postoperatively   Objective: Vitals:   09/10/24 1415 09/10/24 1426 09/10/24 1457 09/10/24 1648  BP: (!) 96/56  113/73 104/63  Pulse: 89 91 (!) 103 89  Resp: 10 12 15  16  Temp: 98.4 F (36.9 C)  98.3 F (36.8 C) 98.3 F (36.8 C)  TempSrc:   Oral Oral  SpO2: 97% 97% 95% 92%  Weight:      Height:        Intake/Output Summary (Last 24 hours) at 09/10/2024 1927 Last data filed at 09/10/2024 1423 Gross per 24 hour  Intake 1000 ml  Output 710 ml  Net 290 ml   Filed Weights   09/09/24 1205 09/09/24 2012 09/10/24 0938  Weight: 61.2 kg 68.3 kg 68.3 kg    Physical Exam  Gen:- Awake Alert, no acute distress HEENT:- [place].AT, No sclera icterus Neck-Supple Neck,No JVD,.  Lungs-  CTAB , fair symmetrical air movement CV- S1, S2 normal, regular  Abd-  +ve B.Sounds, Abd Soft, No tenderness,    Extremity/Skin:- No  edema, pedal pulses present  Psych-affect is appropriate, oriented  x3 Neuro-no new focal deficits, no tremors GU-Foley in situ postoperatively to be removed on 09/28/2024 MSK-right hip postop wound clean dry intact  Data Reviewed: I have personally reviewed following labs and imaging studies  CBC: Recent Labs  Lab 09/09/24 1255 09/10/24 0514  WBC 7.6 6.7  NEUTROABS 5.9  --   HGB 14.1 13.3  HCT 43.2 39.9  MCV 89.3 87.9  PLT 319 285   Basic Metabolic Panel: Recent Labs  Lab 09/09/24 1255 09/10/24 0514  NA 138 139  K 3.5 3.7  CL 103 103  CO2 23 27  GLUCOSE 121* 96  BUN 13 9  CREATININE 0.54 0.53  CALCIUM 9.0 8.5*   GFR: Estimated Creatinine Clearance: 65.6 mL/min (by C-G formula based on SCr of 0.53 mg/dL). Liver Function Tests: Recent Labs  Lab 09/09/24 1255  AST 22  ALT 13  ALKPHOS 113  BILITOT 0.3  PROT 7.3  ALBUMIN 4.8   Radiology Studies: DG HIP UNILAT WITH PELVIS 2-3 VIEWS LEFT Result Date: 09/10/2024 CLINICAL DATA:  Hip fracture, postop. EXAM: DG HIP (WITH OR WITHOUT PELVIS) 2-3V LEFT COMPARISON:  Preoperative imaging FINDINGS: Two screws traverse left femoral neck fracture. Fracture is in near anatomic alignment. Recent postsurgical change includes air and edema in the soft tissues. No additional fracture. IMPRESSION: Pinning of left femoral neck fracture. Electronically Signed   By: Andrea Gasman M.D.   On: 09/10/2024 14:02   DG HIP UNILAT WITH PELVIS 2-3 VIEWS LEFT Result Date: 09/10/2024 CLINICAL DATA:  Pain, left hip surgery. EXAM: DG HIP (WITH OR WITHOUT PELVIS) 2-3V LEFT COMPARISON:  Radiograph yesterday FINDINGS: Seven fluoroscopic spot views of the hip submitted from the operating room. Three screws traverse the left femoral neck fracture. Fluoroscopy time 3 minutes 41 seconds. Dose 25.48 mGy. IMPRESSION: Intraoperative fluoroscopy during left femoral neck fracture fixation. Electronically Signed   By: Andrea Gasman M.D.   On: 09/10/2024 14:01   DG C-Arm 1-60 Min-No Report Result Date: 09/10/2024 Fluoroscopy  was utilized by the requesting physician.  No radiographic interpretation.   CT 3D RECON AT SCANNER Result Date: 09/10/2024 CLINICAL DATA:  Left hip pain after fall. EXAM: CT OF THE LEFT HIP WITHOUT CONTRAST TECHNIQUE: Multidetector CT imaging of the left hip was performed according to the standard protocol. Multiplanar CT image reconstructions were also generated. RADIATION DOSE REDUCTION: This exam was performed according to the departmental dose-optimization program which includes automated exposure control, adjustment of the mA and/or kV according to patient size and/or use of iterative reconstruction technique. COMPARISON:  Left hip radiographs dated 09/09/2024. FINDINGS: Bones/Joint/Cartilage Mildly impacted fracture of the left  femoral neck extending through the medial and lateral cortices. No significant angulation. The left femoral head is seated within the acetabulum. Mild osteoarthritis of the left hip with joint space narrowing. The left sacroiliac joint and pubic symphysis are anatomically aligned. Ligaments Ligaments are suboptimally evaluated by CT. Muscles and Tendons Muscles are within normal limits. Soft tissue No fluid collection. IMPRESSION: 1. Acute mildly impacted fracture of the left femoral neck. 2. Mild osteoarthritis of the left hip. Electronically Signed   By: Harrietta Sherry M.D.   On: 09/10/2024 06:58   CT Hip Left Wo Contrast Result Date: 09/09/2024 CLINICAL DATA:  Left hip pain after fall. EXAM: CT OF THE LEFT HIP WITHOUT CONTRAST TECHNIQUE: Multidetector CT imaging of the left hip was performed according to the standard protocol. Multiplanar CT image reconstructions were also generated. RADIATION DOSE REDUCTION: This exam was performed according to the departmental dose-optimization program which includes automated exposure control, adjustment of the mA and/or kV according to patient size and/or use of iterative reconstruction technique. COMPARISON:  Left hip radiographs dated  09/09/2024. FINDINGS: Bones/Joint/Cartilage Mildly impacted fracture of the left femoral neck extending through the medial and lateral cortices. No significant angulation. The left femoral head is seated within the acetabulum. Mild osteoarthritis of the left hip with joint space narrowing. The left sacroiliac joint and pubic symphysis are anatomically aligned. Ligaments Ligaments are suboptimally evaluated by CT. Muscles and Tendons Muscles are within normal limits. Soft tissue No fluid collection. IMPRESSION: 1. Acute mildly impacted fracture of the left femoral neck. 2. Mild osteoarthritis of the left hip. Electronically Signed   By: Harrietta Sherry M.D.   On: 09/09/2024 16:53   DG HIP UNILAT WITH PELVIS 2-3 VIEWS LEFT Result Date: 09/09/2024 CLINICAL DATA:  Fall, pain. EXAM: DG HIP (WITH OR WITHOUT PELVIS) 2-3V LEFT COMPARISON:  None Available. FINDINGS: Minimally impacted left femoral neck fracture. No angulation. No additional evidence of an acute fracture. IMPRESSION: Minimally impacted left femoral neck fracture. Electronically Signed   By: Newell Eke M.D.   On: 09/09/2024 14:03   DG Chest Port 1 View Result Date: 09/09/2024 CLINICAL DATA:  Preop hip fracture. EXAM: PORTABLE CHEST 1 VIEW COMPARISON:  11/04/2015. FINDINGS: Trachea is midline. Heart is at the upper limits of normal in size. Thoracic aorta is calcified. Minimal streaky scarring or atelectasis in the lung bases. Biapical pleural thickening. No pleural fluid. Osseous structures appear grossly intact. IMPRESSION: Minimal basilar atelectasis or scarring. Electronically Signed   By: Newell Eke M.D.   On: 09/09/2024 14:03   DG Elbow Complete Left Result Date: 09/09/2024 CLINICAL DATA:  Fall, pain. EXAM: LEFT ELBOW - COMPLETE 3+ VIEW COMPARISON:  None Available. FINDINGS: No fracture or dislocation. IMPRESSION: No fracture or dislocation. Electronically Signed   By: Newell Eke M.D.   On: 09/09/2024 14:02   Scheduled Meds:   acetaminophen   500 mg Oral Q6H   [START ON 09/11/2024] aspirin EC  325 mg Oral Q breakfast   docusate sodium  100 mg Oral BID   pantoprazole  40 mg Oral Daily   Continuous Infusions:  sodium chloride       ceFAZolin (ANCEF) IV 2 g (09/10/24 1813)    LOS: 1 day   Rendall Carwin M.D on 09/10/2024 at 7:27 PM  Go to [email or web address] - for contact info  [facility] - Office  [phone]  If 7PM-7AM, please contact night-coverage [email or web address] 09/10/2024, 7:27 PM

## 2024-09-10 NOTE — Anesthesia Postprocedure Evaluation (Signed)
 Anesthesia Post Note  Patient: Lisa Mullins  Procedure(s) Performed: FIXATION, FEMUR, NECK, PERCUTANEOUS, USING SCREW (Left: Hip)  Patient location during evaluation: PACU Anesthesia Type: General Level of consciousness: awake and alert Pain management: pain level controlled Vital Signs Assessment: post-procedure vital signs reviewed and stable Respiratory status: spontaneous breathing, nonlabored ventilation, respiratory function stable and patient connected to nasal cannula oxygen Cardiovascular status: blood pressure returned to baseline and stable Postop Assessment: no apparent nausea or vomiting Anesthetic complications: no   No notable events documented.   Last Vitals:  Vitals:   09/10/24 0722 09/10/24 1305  BP: (!) 114/58 (!) 88/52  Pulse: 80 94  Resp: 18 (!) 7  Temp: 36.7 C 37.1 C  SpO2: 95% (!) 82%    Last Pain:  Vitals:   09/10/24 0938  TempSrc:   PainSc: 10-Worst pain ever                 Andrea Limes

## 2024-09-10 NOTE — Plan of Care (Signed)

## 2024-09-10 NOTE — Consult Note (Signed)
 ORTHOPAEDIC CONSULTATION  REQUESTING PHYSICIAN: Pearlean Manus, MD  ASSESSMENT AND PLAN: 63 y.o. female with the following: Left Hip Valgus impacted femoral neck fracture  This patient requires inpatient admission to the hospitalist, to include preoperative clearance and perioperative medical management  - Weight Bearing Status/Activity: NWB Left lower extremity  - Additional recommended labs/tests: Preop Labs: CBC, BMP, PT/INR, Chest XR, and EKG  -VTE Prophylaxis: Please hold prior to OR; to resume POD#1 at the discretion of the primary team  - Pain control: Recommend PO pain medications PRN; judicious use of narcotics  - Follow-up plan: F/u 10-14 days postop  -Procedures: Plan for OR once patient has been medically optimized  Plan for Left Hip CRPP     Chief Complaint: Left hip pain  HPI: Lisa Mullins is a 63 y.o. female who presented to the ED for evaluation after sustaining a mechanical fall.  She reports that she was in her driveway when she tripped and fell.  She is complaining of left hip and left elbow pain.  She did not hit her head.  She did not use a blood thinner.  She was able to ambulate, get to her car, and drive proceeded to the emergency department.  However, she was unable to get out of her car.  She is otherwise healthy.   Past Medical History:  Diagnosis Date   Benign tumor of breast    Past Surgical History:  Procedure Laterality Date   BREAST SURGERY     TUMOR REMOVAL     from left breast    Social History   Socioeconomic History   Marital status: Divorced    Spouse name: Not on file   Number of children: Not on file   Years of education: Not on file   Highest education level: Not on file  Occupational History   Not on file  Tobacco Use   Smoking status: Former    Current packs/day: 0.50    Types: Cigarettes    Passive exposure: Past   Smokeless tobacco: Not on file  Vaping Use   Vaping status: Never Used  Substance and Sexual  Activity   Alcohol use: No   Drug use: No   Sexual activity: Not on file  Other Topics Concern   Not on file  Social History Narrative   ** Merged History Encounter **       Social Drivers of Health   Financial Resource Strain: Not on file  Food Insecurity: No Food Insecurity (09/10/2024)   Hunger Vital Sign    Worried About Running Out of Food in the Last Year: Never true    Ran Out of Food in the Last Year: Never true  Transportation Needs: No Transportation Needs (09/10/2024)   PRAPARE - Administrator, Civil Service (Medical): No    Lack of Transportation (Non-Medical): No  Physical Activity: Not on file  Stress: Not on file  Social Connections: Moderately Isolated (09/10/2024)   Social Connection and Isolation Panel    Frequency of Communication with Friends and Family: Twice a week    Frequency of Social Gatherings with Friends and Family: Twice a week    Attends Religious Services: 1 to 4 times per year    Active Member of [employer] or Organizations: No    Attends Banker Meetings: Never    Marital Status: Widowed   Family History  Problem Relation Age of Onset   Stroke Other    Allergies  Allergen Reactions  Codeine     Prior to Admission medications   Medication Sig Start Date End Date Taking? Authorizing Provider  acetaminophen  (TYLENOL ) 500 MG tablet Take 1,000 mg by mouth every 6 (six) hours as needed for mild pain.    Yes [provider]   CT 3D RECON AT SCANNER Result Date: 09/10/2024 CLINICAL DATA:  Left hip pain after fall. EXAM: CT OF THE LEFT HIP WITHOUT CONTRAST TECHNIQUE: Multidetector CT imaging of the left hip was performed according to the standard protocol. Multiplanar CT image reconstructions were also generated. RADIATION DOSE REDUCTION: This exam was performed according to the departmental dose-optimization program which includes automated exposure control, adjustment of the mA and/or kV according to patient size and/or  use of iterative reconstruction technique. COMPARISON:  Left hip radiographs dated 09/09/2024. FINDINGS: Bones/Joint/Cartilage Mildly impacted fracture of the left femoral neck extending through the medial and lateral cortices. No significant angulation. The left femoral head is seated within the acetabulum. Mild osteoarthritis of the left hip with joint space narrowing. The left sacroiliac joint and pubic symphysis are anatomically aligned. Ligaments Ligaments are suboptimally evaluated by CT. Muscles and Tendons Muscles are within normal limits. Soft tissue No fluid collection. IMPRESSION: 1. Acute mildly impacted fracture of the left femoral neck. 2. Mild osteoarthritis of the left hip. Electronically Signed   By: Harrietta Sherry M.D.   On: 09/10/2024 06:58   CT Hip Left Wo Contrast Result Date: 09/09/2024 CLINICAL DATA:  Left hip pain after fall. EXAM: CT OF THE LEFT HIP WITHOUT CONTRAST TECHNIQUE: Multidetector CT imaging of the left hip was performed according to the standard protocol. Multiplanar CT image reconstructions were also generated. RADIATION DOSE REDUCTION: This exam was performed according to the departmental dose-optimization program which includes automated exposure control, adjustment of the mA and/or kV according to patient size and/or use of iterative reconstruction technique. COMPARISON:  Left hip radiographs dated 09/09/2024. FINDINGS: Bones/Joint/Cartilage Mildly impacted fracture of the left femoral neck extending through the medial and lateral cortices. No significant angulation. The left femoral head is seated within the acetabulum. Mild osteoarthritis of the left hip with joint space narrowing. The left sacroiliac joint and pubic symphysis are anatomically aligned. Ligaments Ligaments are suboptimally evaluated by CT. Muscles and Tendons Muscles are within normal limits. Soft tissue No fluid collection. IMPRESSION: 1. Acute mildly impacted fracture of the left femoral neck. 2. Mild  osteoarthritis of the left hip. Electronically Signed   By: Harrietta Sherry M.D.   On: 09/09/2024 16:53   DG HIP UNILAT WITH PELVIS 2-3 VIEWS LEFT Result Date: 09/09/2024 CLINICAL DATA:  Fall, pain. EXAM: DG HIP (WITH OR WITHOUT PELVIS) 2-3V LEFT COMPARISON:  None Available. FINDINGS: Minimally impacted left femoral neck fracture. No angulation. No additional evidence of an acute fracture. IMPRESSION: Minimally impacted left femoral neck fracture. Electronically Signed   By: Newell Eke M.D.   On: 09/09/2024 14:03   DG Chest Port 1 View Result Date: 09/09/2024 CLINICAL DATA:  Preop hip fracture. EXAM: PORTABLE CHEST 1 VIEW COMPARISON:  11/04/2015. FINDINGS: Trachea is midline. Heart is at the upper limits of normal in size. Thoracic aorta is calcified. Minimal streaky scarring or atelectasis in the lung bases. Biapical pleural thickening. No pleural fluid. Osseous structures appear grossly intact. IMPRESSION: Minimal basilar atelectasis or scarring. Electronically Signed   By: Newell Eke M.D.   On: 09/09/2024 14:03   DG Elbow Complete Left Result Date: 09/09/2024 CLINICAL DATA:  Fall, pain. EXAM: LEFT ELBOW - COMPLETE 3+ VIEW COMPARISON:  None Available. FINDINGS: No fracture or dislocation. IMPRESSION: No fracture or dislocation. Electronically Signed   By: Newell Eke M.D.   On: 09/09/2024 14:02   Family History Reviewed and non-contributory, no pertinent history of problems with bleeding or anesthesia    Review of Systems No fevers or chills No numbness or tingling No chest pain No shortness of breath No bowel or bladder dysfunction No GI distress No headaches    OBJECTIVE  Vitals:Patient Vitals for the past 8 hrs:  BP Temp Temp src Pulse Resp SpO2  09/10/24 0722 (!) 114/58 98.1 F (36.7 C) Oral 80 18 95 %  09/10/24 0415 111/65 98.6 F (37 C) Oral 85 20 95 %  09/10/24 0226 114/64 98.9 F (37.2 C) Oral (!) 114 (!) 22 96 %   General: Alert, no acute  distress Cardiovascular: Extremities are warm Respiratory: No cyanosis, no use of accessory musculature Skin: No lesions in the area of chief complaint  Neurologic: Sensation intact distally  Psychiatric: Patient is competent for consent with normal mood and affect Lymphatic: No swelling obvious and reported other than the area involved in the exam below Extremities  LLE: Extremity held in a fixed position.  ROM deferred due to known fracture.  Sensation is intact distally in the sural, saphenous, DP, SP, and plantar nerve distribution. 2+ DP pulse.  Toes are WWP.  Active motion intact in the TA/EHL/GS. RLE: Sensation is intact distally in the sural, saphenous, DP, SP, and plantar nerve distribution. 2+ DP pulse.  Toes are WWP.  Active motion intact in the TA/EHL/GS. Tolerates gentle ROM of the hip.  No pain with axial loading.     Test Results Imaging XR of the Left demonstrates a Valgus impacted femoral neck fracture.  Labs cbc Recent Labs    09/09/24 1255 09/10/24 0514  WBC 7.6 6.7  HGB 14.1 13.3  HCT 43.2 39.9  PLT 319 285     Labs coag Recent Labs    09/09/24 1255  INR 0.9    Recent Labs    09/09/24 1255 09/10/24 0514  NA 138 139  K 3.5 3.7  CL 103 103  CO2 23 27  GLUCOSE 121* 96  BUN 13 9  CREATININE 0.54 0.53  CALCIUM 9.0 8.5*

## 2024-09-11 ENCOUNTER — Encounter (HOSPITAL_COMMUNITY): Payer: Self-pay | Admitting: Orthopedic Surgery

## 2024-09-11 DIAGNOSIS — S72002D Fracture of unspecified part of neck of left femur, subsequent encounter for closed fracture with routine healing: Secondary | ICD-10-CM

## 2024-09-11 DIAGNOSIS — K219 Gastro-esophageal reflux disease without esophagitis: Secondary | ICD-10-CM | POA: Diagnosis not present

## 2024-09-11 DIAGNOSIS — Z720 Tobacco use: Secondary | ICD-10-CM | POA: Diagnosis not present

## 2024-09-11 MED ORDER — METHOCARBAMOL 500 MG PO TABS: 500.0000 mg | ORAL_TABLET | Freq: Three times a day (TID) | ORAL | 0 refills | Status: DC | PRN

## 2024-09-11 MED ORDER — DOCUSATE SODIUM 100 MG PO CAPS: 100.0000 mg | ORAL_CAPSULE | Freq: Two times a day (BID) | ORAL | Status: AC | PRN

## 2024-09-11 MED ORDER — HYDROCODONE-ACETAMINOPHEN 7.5-325 MG PO TABS: 1.0000 | ORAL_TABLET | Freq: Three times a day (TID) | ORAL | 0 refills | Status: DC | PRN

## 2024-09-11 NOTE — Plan of Care (Signed)

## 2024-09-11 NOTE — TOC Transition Note (Signed)
 Transition of Care [facility]) - Discharge Note   Patient Details  Name: Lisa Mullins MRN: [id number] Date of Birth: 31-Jan-1961  [facility] CM/SW Contact:  Lucie Lunger, LCSWA Phone Number: 09/11/2024, 2:56 PM   Clinical Narrative:    CSW updated that PT is recommending HH PT for pt at D/C. CSW met with pt and sister at bedside to complete assessment. Pt states she lives alone and is independent and able to drive at baseline. Pt states she is agreeable to [facility] and prefers [facility] as agency for services. Pt states she needs a rolling walker and shower chair. CSW explained RW can be ordered but shower chair is private pay as insurance does not cover, pt states her and her sister will get this. Pt is interested in RW being ordered and delivered to her home. CSW sent referral to Zach with [employer] who will get it processed for delivery. HH PT has been arranged with [facility], [facility] states they will be able to follow pt in community. CSW spoke to Dr. Margrette about signing orders for [facility] once pt is in community as pt does not have PCP (she confirmed this). Dr. Margrette is agreeable, CSW updated Artavia with [facility] of this. TOC signing off.   Final next level of care: Home w Home Health Services Barriers to Discharge: Barriers Resolved   Patient Goals and CMS Choice Patient states their goals for this hospitalization and ongoing recovery are:: return home CMS Medicare.gov Compare Post Acute Care list provided to:: Patient Choice offered to / list presented to : Patient      Discharge Placement                       Discharge Plan and Services Additional resources added to the After Visit Summary for                  DME Arranged: Walker rolling DME Agency: [facility] Date DME Agency Contacted: 09/11/24   Representative spoke with at DME Agency: [facility] HH Arranged: PT HH Agency: [facility] ([facility] Date HH Agency Contacted: 09/11/24   Representative  spoke with at [facility] Agency: [facility]  Social Drivers of Health (SDOH) Interventions SDOH Screenings   Food Insecurity: No Food Insecurity (09/10/2024)  Housing: Low Risk  (09/10/2024)  Transportation Needs: No Transportation Needs (09/10/2024)  Utilities: Not At Risk (09/10/2024)  Social Connections: Moderately Isolated (09/10/2024)  Tobacco Use: Medium Risk (09/10/2024)     Readmission Risk Interventions     No data to display

## 2024-09-11 NOTE — Plan of Care (Signed)
  Problem: Acute Rehab PT Goals(only PT should resolve) Goal: Pt Will Go Supine/Side To Sit Outcome: Progressing Flowsheets (Taken 09/11/2024 1400) Pt will go Supine/Side to Sit: Independently Goal: Patient Will Transfer Sit To/From Stand Outcome: Progressing Flowsheets (Taken 09/11/2024 1400) Patient will transfer sit to/from stand: with modified independence Goal: Pt Will Transfer Bed To Chair/Chair To Bed Outcome: Progressing Flowsheets (Taken 09/11/2024 1400) Pt will Transfer Bed to Chair/Chair to Bed: with modified independence Goal: Pt Will Ambulate Outcome: Progressing Flowsheets (Taken 09/11/2024 1400) Pt will Ambulate:  75 feet  with rolling walker  with supervision Goal: Pt Will Go Up/Down Stairs Outcome: Progressing Flowsheets (Taken 09/11/2024 1400) Pt will Go Up / Down Stairs:  3-5 stairs  with rail(s)  with contact guard assist    2:00 PM, 09/11/24 [name], PT, DPT [place] with [facility]

## 2024-09-11 NOTE — Progress Notes (Signed)
 Patient ID: Lisa Mullins, female   DOB: [birth year], 63 y.o.   MRN: [id number]   POD1  PERCUTANEOUS pinning left hip fracture  The patient will be transferred to orthopedic service.  The patient is not cleared to go home less than 24 hours after surgery.  The patient lives alone.  She is not independent bed to chair see PT notes  She is a fall risk.  She would benefit from another day of inpatient physical therapy which is still less than 48 hours after surgery

## 2024-09-11 NOTE — Discharge Summary (Signed)
 Physician Discharge Summary  Lisa Mullins FMW:[id number] DOB: [birth year] DOA: 09/09/2024  Orthopedic: Margrette   Admit date: 09/09/2024 Discharge date: 09/11/2024  Admitted From:  Home  Disposition:  Home with [facility]   Recommendations for Outpatient Follow-up:  Follow up with PCP in 2 weeks Follow up with Dr. Margrette on 09/25/24 as scheduled   Orthopedics recommendations from Dr. Margrette The patient will be FWB on the operative extremity Discharge home from the PACU once they have recovered DVT prophylaxis Aspirin  81 mg twice daily for 6 weeks.    Pain control with PRN pain medication preferring oral medicines.   Follow up plan will be scheduled in approximately 15 days on 17 December for x-ray and wound check  Home Health:  PT   Discharge Condition: STABLE   CODE STATUS: FULL DIET: resume prior home diet    Brief Hospitalization Summary: Please see all hospital notes, images, labs for full details of the hospitalization. Admission provider HPI:  63 y.o. female with no medical history who was otherwise well until today when she accidentally slipped on a stick and thereby falling on her left hip.  Immediately she started experiencing left hip pain with intensity of 8/10 and therefore brought to the emergency room for further management.  Upon arrival x-ray showed findings of left hip fracture.  ED physician discussed with orthopedic surgeon who recommended patient be admitted and made n.p.o. for surgical intervention tomorrow.  Patient denied headache, she did not pass out, no abdominal pain, no nausea vomiting chest pain or urinary complaint   ED course: Upon arrival to the emergency room patient had temperature 98.6, respiratory rate 19, pulse 84, blood pressure 142/70 saturating 93% on room air.  X-ray of the hip showed left femoral neck fracture.  Given above concerns hospitalist service was therefore contacted to admit patient for further management.   Hospital Course by listed  problems addressed   nondisplaced left femoral neck fracture--status post ORIF on 09/10/2024 -Post-operative plan per Dr. Margrette:  The patient will be FWB on the operative extremity Discharge home from the PACU once they have recovered DVT prophylaxis  6 weeks (aspirin  81 mg BID).  Pain control with PRN pain medication preferring oral medicines.   Follow up plan will be scheduled in approximately 15 days on 17 December for x-ray and wound check  -- Further management per orthopedic team - PT eval requested  - pt decided she is going home and will not go to SNF rehab   2) nicotine dependence-patient uses vaping - Abstinence from nicotine advised   3)GERD--continue Protonix   Discharge Diagnoses:  Principal Problem:   Closed left hip fracture (HCC) Active Problems:   Nicotine abuse   GERD (gastroesophageal reflux disease)  Discharge Instructions:  Allergies as of 09/11/2024       Reactions   Codeine          Medication List     TAKE these medications    acetaminophen  500 MG tablet Commonly known as: TYLENOL  Take 1,000 mg by mouth every 6 (six) hours as needed for mild pain.   docusate sodium  100 MG capsule Commonly known as: COLACE Take 1 capsule (100 mg total) by mouth 2 (two) times daily as needed for mild constipation.   HYDROcodone -acetaminophen  7.5-325 MG tablet Commonly known as: NORCO Take 1 tablet by mouth 3 (three) times daily as needed for severe pain (pain score 7-10).   methocarbamol  500 MG tablet Commonly known as: ROBAXIN  Take 1 tablet (500 mg total) by mouth  every 8 (eight) hours as needed for muscle spasms.        Follow-up Information     Margrette Taft BRAVO, MD. Go on 09/25/2024.   Specialties: Orthopedic Surgery, Radiology Why: Hospital Follow Up, For wound re-check Contact information: [address] [place] [address] [phone]                Allergies  Allergen Reactions   Codeine     Allergies as of  09/11/2024       Reactions   Codeine          Medication List     TAKE these medications    acetaminophen  500 MG tablet Commonly known as: TYLENOL  Take 1,000 mg by mouth every 6 (six) hours as needed for mild pain.   docusate sodium 100 MG capsule Commonly known as: COLACE Take 1 capsule (100 mg total) by mouth 2 (two) times daily as needed for mild constipation.   HYDROcodone -acetaminophen  7.5-325 MG tablet Commonly known as: NORCO Take 1 tablet by mouth 3 (three) times daily as needed for severe pain (pain score 7-10).   methocarbamol 500 MG tablet Commonly known as: ROBAXIN Take 1 tablet (500 mg total) by mouth every 8 (eight) hours as needed for muscle spasms.        Procedures/Studies: DG HIP UNILAT WITH PELVIS 2-3 VIEWS LEFT Result Date: 09/10/2024 CLINICAL DATA:  Hip fracture, postop. EXAM: DG HIP (WITH OR WITHOUT PELVIS) 2-3V LEFT COMPARISON:  Preoperative imaging FINDINGS: Two screws traverse left femoral neck fracture. Fracture is in near anatomic alignment. Recent postsurgical change includes air and edema in the soft tissues. No additional fracture. IMPRESSION: Pinning of left femoral neck fracture. Electronically Signed   By: Andrea Gasman M.D.   On: 09/10/2024 14:02   DG HIP UNILAT WITH PELVIS 2-3 VIEWS LEFT Result Date: 09/10/2024 CLINICAL DATA:  Pain, left hip surgery. EXAM: DG HIP (WITH OR WITHOUT PELVIS) 2-3V LEFT COMPARISON:  Radiograph yesterday FINDINGS: Seven fluoroscopic spot views of the hip submitted from the operating room. Three screws traverse the left femoral neck fracture. Fluoroscopy time 3 minutes 41 seconds. Dose 25.48 mGy. IMPRESSION: Intraoperative fluoroscopy during left femoral neck fracture fixation. Electronically Signed   By: Andrea Gasman M.D.   On: 09/10/2024 14:01   DG C-Arm 1-60 Min-No Report Result Date: 09/10/2024 Fluoroscopy was utilized by the requesting physician.  No radiographic interpretation.   CT 3D RECON AT  SCANNER Result Date: 09/10/2024 CLINICAL DATA:  Left hip pain after fall. EXAM: CT OF THE LEFT HIP WITHOUT CONTRAST TECHNIQUE: Multidetector CT imaging of the left hip was performed according to the standard protocol. Multiplanar CT image reconstructions were also generated. RADIATION DOSE REDUCTION: This exam was performed according to the departmental dose-optimization program which includes automated exposure control, adjustment of the mA and/or kV according to patient size and/or use of iterative reconstruction technique. COMPARISON:  Left hip radiographs dated 09/09/2024. FINDINGS: Bones/Joint/Cartilage Mildly impacted fracture of the left femoral neck extending through the medial and lateral cortices. No significant angulation. The left femoral head is seated within the acetabulum. Mild osteoarthritis of the left hip with joint space narrowing. The left sacroiliac joint and pubic symphysis are anatomically aligned. Ligaments Ligaments are suboptimally evaluated by CT. Muscles and Tendons Muscles are within normal limits. Soft tissue No fluid collection. IMPRESSION: 1. Acute mildly impacted fracture of the left femoral neck. 2. Mild osteoarthritis of the left hip. Electronically Signed   By: Harrietta Sherry M.D.   On: 09/10/2024  06:58   CT Hip Left Wo Contrast Result Date: 09/09/2024 CLINICAL DATA:  Left hip pain after fall. EXAM: CT OF THE LEFT HIP WITHOUT CONTRAST TECHNIQUE: Multidetector CT imaging of the left hip was performed according to the standard protocol. Multiplanar CT image reconstructions were also generated. RADIATION DOSE REDUCTION: This exam was performed according to the departmental dose-optimization program which includes automated exposure control, adjustment of the mA and/or kV according to patient size and/or use of iterative reconstruction technique. COMPARISON:  Left hip radiographs dated 09/09/2024. FINDINGS: Bones/Joint/Cartilage Mildly impacted fracture of the left femoral neck  extending through the medial and lateral cortices. No significant angulation. The left femoral head is seated within the acetabulum. Mild osteoarthritis of the left hip with joint space narrowing. The left sacroiliac joint and pubic symphysis are anatomically aligned. Ligaments Ligaments are suboptimally evaluated by CT. Muscles and Tendons Muscles are within normal limits. Soft tissue No fluid collection. IMPRESSION: 1. Acute mildly impacted fracture of the left femoral neck. 2. Mild osteoarthritis of the left hip. Electronically Signed   By: Harrietta Sherry M.D.   On: 09/09/2024 16:53   DG HIP UNILAT WITH PELVIS 2-3 VIEWS LEFT Result Date: 09/09/2024 CLINICAL DATA:  Fall, pain. EXAM: DG HIP (WITH OR WITHOUT PELVIS) 2-3V LEFT COMPARISON:  None Available. FINDINGS: Minimally impacted left femoral neck fracture. No angulation. No additional evidence of an acute fracture. IMPRESSION: Minimally impacted left femoral neck fracture. Electronically Signed   By: Newell Eke M.D.   On: 09/09/2024 14:03   DG Chest Port 1 View Result Date: 09/09/2024 CLINICAL DATA:  Preop hip fracture. EXAM: PORTABLE CHEST 1 VIEW COMPARISON:  11/04/2015. FINDINGS: Trachea is midline. Heart is at the upper limits of normal in size. Thoracic aorta is calcified. Minimal streaky scarring or atelectasis in the lung bases. Biapical pleural thickening. No pleural fluid. Osseous structures appear grossly intact. IMPRESSION: Minimal basilar atelectasis or scarring. Electronically Signed   By: Newell Eke M.D.   On: 09/09/2024 14:03   DG Elbow Complete Left Result Date: 09/09/2024 CLINICAL DATA:  Fall, pain. EXAM: LEFT ELBOW - COMPLETE 3+ VIEW COMPARISON:  None Available. FINDINGS: No fracture or dislocation. IMPRESSION: No fracture or dislocation. Electronically Signed   By: Newell Eke M.D.   On: 09/09/2024 14:02     Subjective: Pt reports she is eager to go home, declines SNF, says she has a lot of support at home from  family and will do better at home.   Discharge Exam: Vitals:   09/11/24 0845 09/11/24 1310  BP: 110/64 (!) 103/54  Pulse: 89 (!) 105  Resp: 16 16  Temp: 98.4 F (36.9 C) 98.2 F (36.8 C)  SpO2: 95% 93%   Vitals:   09/10/24 2354 09/11/24 0316 09/11/24 0845 09/11/24 1310  BP: (!) 91/50 (!) 94/44 110/64 (!) 103/54  Pulse: 83 88 89 (!) 105  Resp: 16 16 16 16   Temp: 98.8 F (37.1 C) 98.6 F (37 C) 98.4 F (36.9 C) 98.2 F (36.8 C)  TempSrc: Oral Oral Oral Oral  SpO2: 94% 95% 95% 93%  Weight:      Height:        General: Pt is alert, awake, not in acute distress Cardiovascular: RRR, S1/S2 +, no rubs, no gallops Respiratory: CTA bilaterally, no wheezing, no rhonchi Abdominal: Soft, NT, ND, bowel sounds + Extremities: bandage clean, dry, intact.    The results of significant diagnostics from this hospitalization (including imaging, microbiology, ancillary and laboratory) are listed below for  reference.     Microbiology: No results found for this or any previous visit (from the past 240 hours).   Labs: BNP (last 3 results) No results for input(s): BNP in the last 8760 hours. Basic Metabolic Panel: Recent Labs  Lab 09/09/24 1255 09/10/24 0514  NA 138 139  K 3.5 3.7  CL 103 103  CO2 23 27  GLUCOSE 121* 96  BUN 13 9  CREATININE 0.54 0.53  CALCIUM 9.0 8.5*   Liver Function Tests: Recent Labs  Lab 09/09/24 1255  AST 22  ALT 13  ALKPHOS 113  BILITOT 0.3  PROT 7.3  ALBUMIN 4.8   No results for input(s): LIPASE, AMYLASE in the last 168 hours. No results for input(s): AMMONIA in the last 168 hours. CBC: Recent Labs  Lab 09/09/24 1255 09/10/24 0514  WBC 7.6 6.7  NEUTROABS 5.9  --   HGB 14.1 13.3  HCT 43.2 39.9  MCV 89.3 87.9  PLT 319 285   Cardiac Enzymes: No results for input(s): CKTOTAL, CKMB, CKMBINDEX, TROPONINI in the last 168 hours. BNP: Invalid input(s): POCBNP CBG: No results for input(s): GLUCAP in the last 168  hours. D-Dimer No results for input(s): DDIMER in the last 72 hours. Hgb A1c No results for input(s): HGBA1C in the last 72 hours. Lipid Profile No results for input(s): CHOL, HDL, LDLCALC, TRIG, CHOLHDL, LDLDIRECT in the last 72 hours. Thyroid function studies No results for input(s): TSH, T4TOTAL, T3FREE, THYROIDAB in the last 72 hours.  Invalid input(s): FREET3 Anemia work up No results for input(s): VITAMINB12, FOLATE, FERRITIN, TIBC, IRON, RETICCTPCT in the last 72 hours. Urinalysis No results found for: COLORURINE, APPEARANCEUR, LABSPEC, PHURINE, GLUCOSEU, HGBUR, BILIRUBINUR, KETONESUR, PROTEINUR, UROBILINOGEN, NITRITE, LEUKOCYTESUR Sepsis Labs Recent Labs  Lab 09/09/24 1255 09/10/24 0514  WBC 7.6 6.7   Microbiology No results found for this or any previous visit (from the past 240 hours).  Time coordinating discharge: 42 mins   SIGNED:  Afton Louder, MD  [facility] 09/11/2024, 2:01 PM How to contact the [facility] Attending or Consulting provider 7A - 7P or covering provider during after hours 7P -7A, for this patient?  Check the care team in [facility] and look for a) attending/consulting [facility] provider listed and b) the [facility] team listed Log into [email or web address] and use [facility]'s universal password to access. If you do not have the password, please contact the hospital operator. Locate the [facility] provider you are looking for under [facility] and page to a number that you can be directly reached. If you still have difficulty reaching the provider, please page the [facility] (Director on Call) for the Hospitalists listed on amion for assistance.

## 2024-09-11 NOTE — Progress Notes (Signed)
 Subjective: 1 Day Post-Op Procedure(s) (LRB): FIXATION, FEMUR, NECK, PERCUTANEOUS, USING SCREW (Left) Patient reports pain as 4 on 0-10 scale.    Objective: Vital signs in last 24 hours: Temp:  [98.3 F (36.8 C)-98.8 F (37.1 C)] 98.6 F (37 C) (12/03 0316) Pulse Rate:  [83-103] 88 (12/03 0316) Resp:  [7-16] 16 (12/03 0316) BP: (88-113)/(44-73) 94/44 (12/03 0316) SpO2:  [82 %-100 %] 95 % (12/03 0316) Weight:  [68.3 kg] 68.3 kg (12/02 0938)  Intake/Output from previous day: 12/02 0701 - 12/03 0700 In: 1300.2 [I.V.:900; IV Piggyback:400.2] Out: 1060 [Urine:960; Blood:100] Intake/Output this shift: Total I/O In: 240 [P.O.:240] Out: -   Recent Labs    09/09/24 1255 09/10/24 0514  HGB 14.1 13.3   Recent Labs    09/09/24 1255 09/10/24 0514  WBC 7.6 6.7  RBC 4.84 4.54  HCT 43.2 39.9  PLT 319 285   Recent Labs    09/09/24 1255 09/10/24 0514  NA 138 139  K 3.5 3.7  CL 103 103  CO2 23 27  BUN 13 9  CREATININE 0.54 0.53  GLUCOSE 121* 96  CALCIUM 9.0 8.5*   Recent Labs    09/09/24 1255  INR 0.9       Assessment/Plan: 1 Day Post-Op Procedure(s) (LRB): FIXATION, FEMUR, NECK, PERCUTANEOUS, USING SCREWS (Left)  Chart review stable pain control with hydrocodone   Postop  DVT prophylaxis aspirin for 6 weeks  Weightbearing status as tolerated  Follow-up appointment December 17    Lisa Mullins 09/11/2024, 7:45 AM

## 2024-09-11 NOTE — Evaluation (Signed)
 Physical Therapy Evaluation Patient Details Name: Lisa Mullins MRN: [id number] DOB: [birth year] Today's Date: 09/11/2024  History of Present Illness  Lisa Mullins is a 63 y.o. female with no medical history who was otherwise well until today when she accidentally slipped on a stick and thereby falling on her left hip.  Immediately she started experiencing left hip pain with intensity of 8/10 and therefore brought to the emergency room for further management.  Upon arrival x-ray showed findings of left hip fracture.  ED physician discussed with orthopedic surgeon who recommended patient be admitted and made n.p.o. for surgical intervention tomorrow.  Patient denied headache, she did not pass out, no abdominal pain, no nausea vomiting chest pain or urinary complaint   Clinical Impression  Patient agreeable to PT evaluation. Patient was received sitting in recliner at start of session. 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 This date, patient is modified independent with bed mobility, and CGA/supervision during transfers and ambulation for safety. Patient reports having a good support system with family/friends planning on staying with her during recovery. Patient does not have any equipment at home. Would recommend RW and shower seat for maximum safety once home to prevent possibility of falls. Patient returns to bed at end of session, call button in reach, all needs met, and friend present. Patient will benefit from continued skilled physical therapy acutely and in recommended venue in order to address current deficits and return to PLOF.        If plan is discharge home, recommend the following: A little help with walking and/or transfers;A little help with bathing/dressing/bathroom;Assist for transportation;Help with stairs or ramp for entrance   Can travel by private vehicle        Equipment  Recommendations Rolling walker (2 wheels);BSC/3in1  Recommendations for Other Services       Functional Status Assessment Patient has had a recent decline in their functional status and demonstrates the ability to make significant improvements in function in a reasonable and predictable amount of time.     Precautions / Restrictions Precautions Precautions: Fall Recall of Precautions/Restrictions: Intact Restrictions Weight Bearing Restrictions Per Provider Order: No      Mobility  Bed Mobility Overal bed mobility: Modified Independent             General bed mobility comments: pt received in chair, mod Independent with sit to supine this date, inc time required but no physical assist needed, HOB flat, slight use of railings, reports she got herself over into chair this morning    Transfers Overall transfer level: Needs assistance Equipment used: Rolling walker (2 wheels) Transfers: Sit to/from Stand Sit to Stand: Contact guard assist           General transfer comment: CGA for safety during STS from chair, inc time needed to scoo to EOB due to inc pain, no overt LOB or unsteadiness    Ambulation/Gait Ambulation/Gait assistance: Contact guard assist Gait Distance (Feet): 60 Feet Assistive device: Rolling walker (2 wheels) Gait Pattern/deviations: Step-to pattern, Decreased step length - right, Decreased stance time - left, Decreased stride length, Antalgic Gait velocity: Dec     General Gait Details: pt ambulates in room and halls with RW and CGA for safety. dec weight shift/stance on LLE, pt reports 6/10 pain, limited by fatigue but demonstrates good endurance post-surgery day 1, no unsteadiness or LOB  Stairs  Wheelchair Mobility     Tilt Bed    Modified Rankin (Stroke Patients Only)       Balance Overall balance assessment: Mild deficits observed, not formally tested                                            Pertinent Vitals/Pain Pain Assessment Pain Assessment: 0-10 Pain Score: 5  Pain Location: L hip Pain Descriptors / Indicators: Aching Pain Intervention(s): Limited activity within patient's tolerance, Repositioned, Monitored during session    Home Living Family/patient expects to be discharged to:: Private residence Living Arrangements: Alone Available Help at Discharge: Friend(s);Family;Available 24 hours/day Type of Home: House Home Access: Stairs to enter Entrance Stairs-Rails: Can reach both Entrance Stairs-Number of Steps: 3   Home Layout: One level Home Equipment: Grab bars - tub/shower;Grab bars - toilet      Prior Function Prior Level of Function : Independent/Modified Independent;Working/employed;Driving             Mobility Comments: 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, structural, driving, 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 ADLs Comments: Independent     Extremity/Trunk Assessment   Upper Extremity Assessment Upper Extremity Assessment: Overall WFL for tasks assessed    Lower Extremity Assessment Lower Extremity Assessment: LLE deficits/detail;RLE deficits/detail RLE Deficits / Details: adequate strength in RLE for functional transfers and ambulation LLE Deficits / Details: Unable to bear full weight but noted some weight bearing in LLE this date as pt leads with LLE when ambulating, reports inc discomfort LLE: Unable to fully assess due to pain LLE Coordination: decreased gross motor    Cervical / Trunk Assessment Cervical / Trunk Assessment: Normal  Communication   Communication Communication: No apparent difficulties    Cognition Arousal: Alert Behavior During Therapy: WFL for tasks assessed/performed   PT - Cognitive impairments: No apparent impairments                         Following commands: Intact       Cueing Cueing Techniques: Verbal cues, Visual cues, Tactile cues     General Comments      Exercises      Assessment/Plan    PT Assessment All further PT needs can be met in the next venue of care;Patient needs continued PT services  PT Problem List Decreased strength;Decreased range of motion;Decreased activity tolerance;Decreased balance;Decreased mobility;Pain       PT Treatment Interventions DME instruction;Balance training;Gait training;Stair training;Functional mobility training;Therapeutic activities;Therapeutic exercise;Patient/family education    PT Goals (Current goals can be found in the Care Plan section)  Acute Rehab PT Goals Patient Stated Goal: Return home PT Goal Formulation: With patient Time For Goal Achievement: 09/18/24 Potential to Achieve Goals: Good    Frequency Min 3X/week     Co-evaluation               AM-PAC PT 6 Clicks Mobility  Outcome Measure Help needed turning from your back to your side while in a flat bed without using bedrails?: None Help needed moving from lying on your back to sitting on the side of a flat bed without using bedrails?: None Help needed moving to and from a bed to a chair (including a wheelchair)?: A Little Help needed standing up from a chair using your arms (e.g., wheelchair or bedside chair)?: A Little Help needed to  walk in hospital room?: A Little Help needed climbing 3-5 steps with a railing? : A Lot 6 Click Score: 19    End of Session   Activity Tolerance: Patient tolerated treatment well;Patient limited by fatigue Patient left: in bed;with call bell/phone within reach;with family/visitor present   PT Visit Diagnosis: Other abnormalities of gait and mobility (R26.89);Muscle weakness (generalized) (M62.81);Difficulty in walking, not elsewhere classified (R26.2);Pain Pain - Right/Left: Left Pain - part of body: Hip    Time: 1020-1030 PT Time Calculation (min) (ACUTE ONLY): 10 min   Charges:   PT Evaluation $PT Eval Low Complexity: 1 Low   PT General Charges $$ ACUTE PT VISIT: 1 Visit         1:59  PM, 09/11/24 Kadien Lineman Powell-Butler, PT, DPT [place] with [facility]

## 2024-09-12 MED ORDER — ALPRAZOLAM 0.25 MG PO TABS: 0.2500 mg | ORAL_TABLET | Freq: Every evening | ORAL | 0 refills | Status: AC | PRN

## 2024-09-12 MED ORDER — HYDROCODONE-ACETAMINOPHEN 10-325 MG PO TABS: 1.0000 | ORAL_TABLET | ORAL | 0 refills | Status: AC | PRN

## 2024-09-12 MED ORDER — CYCLOBENZAPRINE HCL 10 MG PO TABS: 10.0000 mg | ORAL_TABLET | Freq: Three times a day (TID) | ORAL | 1 refills | Status: AC | PRN

## 2024-09-12 MED ORDER — ASPIRIN 81 MG PO TBEC: 81.0000 mg | DELAYED_RELEASE_TABLET | Freq: Every day | ORAL | 2 refills | Status: AC

## 2024-09-12 NOTE — Progress Notes (Signed)
 Physical Therapy Treatment Patient Details Name: Lisa Mullins MRN: [id number] DOB: [birth year] Today's Date: 09/12/2024   History of Present Illness OTTIE NEGLIA is a 63 y.o. female with no medical history who was otherwise well until today when she accidentally slipped on a stick and thereby falling on her left hip.  Immediately she started experiencing left hip pain with intensity of 8/10 and therefore brought to the emergency room for further management.  Upon arrival x-ray showed findings of left hip fracture.  ED physician discussed with orthopedic surgeon who recommended patient be admitted and made n.p.o. for surgical intervention tomorrow.  Patient denied headache, she did not pass out, no abdominal pain, no nausea vomiting chest pain or urinary complaint    PT Comments  Patient agreeable and motivated for therapy. Patient demonstrates good return for getting into/out of bed and moving LLE without assistance with HOB flat, demonstrated increased endurance/distance for gait training without loss of balance using RW and limited mostly due to fatigue. Patient demonstrated fair/good return for going up down steps in stair well using bilateral side rails without loss of balance and understanding acknowledged. Patient will benefit from continued skilled physical therapy in hospital and recommended venue below to increase strength, balance, endurance for safe ADLs and gait.     If plan is discharge home, recommend the following: A little help with walking and/or transfers;A little help with bathing/dressing/bathroom;Assist for transportation;Help with stairs or ramp for entrance   Can travel by private vehicle        Equipment Recommendations  Rolling walker (2 wheels);BSC/3in1    Recommendations for Other Services       Precautions / Restrictions Precautions Precautions: Fall Recall of Precautions/Restrictions: Intact Restrictions Weight Bearing Restrictions Per Provider Order: No      Mobility  Bed Mobility Overal bed mobility: Modified Independent                  Transfers Overall transfer level: Needs assistance Equipment used: Rolling walker (2 wheels) Transfers: Sit to/from Stand, Bed to chair/wheelchair/BSC Sit to Stand: Supervision   Step pivot transfers: Modified independent (Device/Increase time), Supervision       General transfer comment: increased time with labored movement for completing sit to stand using RW    Ambulation/Gait Ambulation/Gait assistance: Modified independent (Device/Increase time), Supervision Gait Distance (Feet): 100 Feet Assistive device: Rolling walker (2 wheels) Gait Pattern/deviations: Decreased step length - right, Decreased step length - left, Decreased stance time - left, Antalgic, Decreased stride length Gait velocity: Dec     General Gait Details: increased endurance, distance for gait training with fair carryover for left heel to toe stepping, no loss of balance and limited mostly due to c/o fatigue   Stairs Stairs: Yes Stairs assistance: Supervision, Contact guard assist Stair Management: Two rails, Step to pattern Number of Stairs: 4 General stair comments: demonstrates fair/good return for going up/down steps using bilateral side rails without loss of balance   Wheelchair Mobility     Tilt Bed    Modified Rankin (Stroke Patients Only)       Balance Overall balance assessment: Needs assistance Sitting-balance support: Feet supported Sitting balance-Leahy Scale: Good Sitting balance - Comments: seated at EOB   Standing balance support: Reliant on assistive device for balance, During functional activity, Bilateral upper extremity supported Standing balance-Leahy Scale: Fair Standing balance comment: fair/good using RW  Communication Communication Communication: No apparent difficulties  Cognition Arousal: Alert Behavior During Therapy: WFL for  tasks assessed/performed   PT - Cognitive impairments: No apparent impairments                         Following commands: Intact      Cueing Cueing Techniques: Verbal cues  Exercises      General Comments        Pertinent Vitals/Pain Pain Assessment Pain Assessment: 0-10 Pain Score: 7  Pain Location: Left hip Pain Descriptors / Indicators: Constant, Sore, Discomfort Pain Intervention(s): Limited activity within patient's tolerance, Monitored during session, Repositioned    Home Living                          Prior Function            PT Goals (current goals can now be found in the care plan section) Acute Rehab PT Goals Patient Stated Goal: Return home PT Goal Formulation: With patient Time For Goal Achievement: 09/18/24 Potential to Achieve Goals: Good Progress towards PT goals: Progressing toward goals    Frequency    Min 3X/week      PT Plan      Co-evaluation              AM-PAC PT 6 Clicks Mobility   Outcome Measure  Help needed turning from your back to your side while in a flat bed without using bedrails?: None Help needed moving from lying on your back to sitting on the side of a flat bed without using bedrails?: None Help needed moving to and from a bed to a chair (including a wheelchair)?: A Little Help needed standing up from a chair using your arms (e.g., wheelchair or bedside chair)?: A Little Help needed to walk in hospital room?: A Little Help needed climbing 3-5 steps with a railing? : A Little 6 Click Score: 20    End of Session   Activity Tolerance: Patient tolerated treatment well;Patient limited by fatigue Patient left: in bed;with call bell/phone within reach Nurse Communication: Mobility status PT Visit Diagnosis: Other abnormalities of gait and mobility (R26.89);Muscle weakness (generalized) (M62.81);Difficulty in walking, not elsewhere classified (R26.2);Pain Pain - Right/Left: Left Pain - part  of body: Hip     Time: 8664-8647 PT Time Calculation (min) (ACUTE ONLY): 17 min  Charges:    $Gait Training: 8-22 mins PT General Charges $$ ACUTE PT VISIT: 1 Visit                     2:07 PM, 09/12/24 Lynwood Music, MPT Physical Therapist with [facility] [id number] [phone] office [phone] mobile phone

## 2024-09-12 NOTE — Plan of Care (Signed)

## 2024-09-12 NOTE — Evaluation (Signed)
 Occupational Therapy Evaluation Patient Details Name: Lisa Mullins MRN: [id number] DOB: Mar 07, 1961 Today's Date: 09/12/2024   History of Present Illness   Lisa Mullins is a 63 y.o. female with no medical history who was otherwise well until today when she accidentally slipped on a stick and thereby falling on her left hip.  Immediately she started experiencing left hip pain with intensity of 8/10 and therefore brought to the emergency room for further management.  Upon arrival x-ray showed findings of left hip fracture.  ED physician discussed with orthopedic surgeon who recommended patient be admitted and made n.p.o. for surgical intervention tomorrow.  Patient denied headache, she did not pass out, no abdominal pain, no nausea vomiting chest pain or urinary complaint     Clinical Impressions Pt agreeable to OT and PT co-evaluation/treatment. Pt able to get in and out of bed without physical assist with some labored effort. Pt able to complete transfers using RW with supervision to mod I level of assist. Pt will need moderate assistance for lower body ADL's, but reports having much family support. Pt reports having a long handled sponge and a shower chair for bathing. Pt is not recommended for any further acute OT services and will be discharged to care of nursing staff for remaining length of stay.         If plan is discharge home, recommend the following:   A little help with walking and/or transfers;A lot of help with bathing/dressing/bathroom;Assistance with cooking/housework;Help with stairs or ramp for entrance;Assist for transportation     Functional Status Assessment   Patient has had a recent decline in their functional status and demonstrates the ability to make significant improvements in function in a reasonable and predictable amount of time.     Equipment Recommendations   None recommended by OT             Precautions/Restrictions    Precautions Precautions: Fall Recall of Precautions/Restrictions: Intact Restrictions Weight Bearing Restrictions Per Provider Order: No     Mobility Bed Mobility Overal bed mobility: Modified Independent                  Transfers Overall transfer level: Needs assistance Equipment used: Rolling walker (2 wheels) Transfers: Sit to/from Stand Sit to Stand: Supervision           General transfer comment: labored efffort for sit to stand from EOB with RW.      Balance Overall balance assessment: Needs assistance Sitting-balance support: Feet supported, No upper extremity supported Sitting balance-Leahy Scale: Good Sitting balance - Comments: seated at EOB   Standing balance support: Reliant on assistive device for balance, During functional activity, Bilateral upper extremity supported Standing balance-Leahy Scale: Fair Standing balance comment: fair/good using RW                           ADL either performed or assessed with clinical judgement   ADL Overall ADL's : Needs assistance/impaired     Grooming: Set up;Sitting       Lower Body Bathing: Moderate assistance;Sitting/lateral leans       Lower Body Dressing: Moderate assistance;Sitting/lateral leans   Toilet Transfer: Modified Independent;Rolling walker (2 wheels);Ambulation Toilet Transfer Details (indicate cue type and reason): Simulated via ambulation in the room and hall.         Functional mobility during ADLs: Modified independent;Rolling walker (2 wheels)       Vision Baseline Vision/History: 1 Wears glasses  Ability to See in Adequate Light: 0 Adequate Patient Visual Report: No change from baseline Vision Assessment?: Wears glasses for reading     Perception Perception: Not tested       Praxis Praxis: Not tested       Pertinent Vitals/Pain Pain Assessment Pain Assessment: 0-10 Pain Score: 7  Pain Location: Left hip Pain Descriptors / Indicators: Constant Pain  Intervention(s): Monitored during session, Repositioned, Limited activity within patient's tolerance     Extremity/Trunk Assessment Upper Extremity Assessment Upper Extremity Assessment: Overall WFL for tasks assessed   Lower Extremity Assessment Lower Extremity Assessment: Defer to PT evaluation   Cervical / Trunk Assessment Cervical / Trunk Assessment: Normal   Communication Communication Communication: No apparent difficulties   Cognition Arousal: Alert Behavior During Therapy: WFL for tasks assessed/performed Cognition: No apparent impairments                               Following commands: Intact       Cueing  General Comments   Cueing Techniques: Verbal cues                 Home Living Family/patient expects to be discharged to:: Private residence Living Arrangements: Alone Available Help at Discharge: Friend(s);Family;Available 24 hours/day Type of Home: House Home Access: Stairs to enter [employer] of Steps: 3 Entrance Stairs-Rails: Can reach both Home Layout: One level     Bathroom Shower/Tub: Tub/shower unit;Walk-in shower   Bathroom Toilet: Standard Bathroom Accessibility: Yes   Home Equipment: Grab bars - tub/shower;Grab bars - toilet;BSC/3in1;Shower seat;Adaptive equipment Adaptive Equipment: Long-handled sponge        Prior Functioning/Environment Prior Level of Function : Independent/Modified Independent;Working/employed;Driving             Mobility Comments: PT is working as a engineer, structural, driving, 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 ADLs Comments: Independent                            Co-evaluation PT/OT/SLP Co-Evaluation/Treatment: Yes Reason for Co-Treatment: To address functional/ADL transfers   OT goals addressed during session: ADL's and self-care                       End of Session Equipment Utilized During Treatment: Rolling walker (2 wheels)  Activity Tolerance:  Patient tolerated treatment well Patient left: in bed;with call bell/phone within reach  OT Visit Diagnosis: Unsteadiness on feet (R26.81);Other abnormalities of gait and mobility (R26.89);Muscle weakness (generalized) (M62.81)                Time: 8657-8646 OT Time Calculation (min): 11 min Charges:  OT General Charges $OT Visit: 1 Visit OT Evaluation $OT Eval Low Complexity: 1 Low  Rockney Grenz OT, MOT  Jayson Person 09/12/2024, 2:22 PM

## 2024-09-12 NOTE — Care Management Important Message (Signed)
 Important Message  Patient Details  Name: Lisa Mullins MRN: [id number] Date of Birth: 10/10/1961   Important Message Given:  N/A - LOS <3 / Initial given by admissions     Lisa Mullins [name] 09/12/2024, 10:21 AM

## 2024-09-12 NOTE — Discharge Summary (Signed)
 Physician Discharge Summary  Patient ID: Lisa Mullins MRN: [id number] DOB/AGE: Dec 25, 1960 63 y.o.  Admit date: 09/09/2024 Discharge date: 09/12/2024  Admission Diagnoses:  Left femoral neck fracture  Discharge Diagnoses:  Principal Problem:   Closed left hip fracture (HCC) Active Problems:   Nicotine abuse   GERD (gastroesophageal reflux disease)   Discharged Condition: good  Hospital Course: The patient had an uncomplicated course.  She came in on the first she had surgery on the second she had physical therapy on the 3rd and 4th.  She had a mild elevated temperature of 100 and a sore throat which was treated with incentive spirometry Tylenol  and throat spray.  Wound clean dry.  No sign of DVT.  No sign of urinary tract infection.  Consults: Physical therapy see notes  Significant Diagnostic Studies: labs:     Latest Ref Rng & Units 09/10/2024    5:14 AM 09/09/2024   12:55 PM 11/04/2015    1:43 PM  CBC  WBC 4.0 - 10.5 K/uL 6.7  7.6  5.7   Hemoglobin 12.0 - 15.0 g/dL 86.6  85.8  85.9   Hematocrit 36.0 - 46.0 % 39.9  43.2  42.5   Platelets 150 - 400 K/uL 285  319  269     Discharge Exam: Blood pressure (!) 107/50, pulse 84, temperature 100 F (37.8 C), temperature source Oral, resp. rate 18, height 5' 6 (1.676 m), weight 68.3 kg, SpO2 99%. The patient was awake alert and oriented x 3  Her wound and dressing were dry her calf was supple she had no belly pain or suprapubic pain  Disposition: Discharge disposition: 01-Home or Self Care       Discharge Instructions     Call MD / Call 911   Complete by: As directed    If you experience chest pain or shortness of breath, CALL 911 and be transported to the hospital emergency room.  If you develope a fever above 101 F, pus (white drainage) or increased drainage or redness at the wound, or calf pain, call your surgeon's office.   Constipation Prevention   Complete by: As directed    Drink plenty of fluids.  Prune juice  may be helpful.  You may use a stool softener, such as Colace (over the counter) 100 mg twice a day.  Use MiraLax (over the counter) for constipation as needed.   Diet - low sodium heart healthy   Complete by: As directed    Discharge instructions   Complete by: As directed    You do not need to change the dressing but you do need to keep it dry  No shower until the dressing is changed by the doctor  Ice pack 30 minutes 3-4 times a day depending on swelling left thigh  Exercises at home per physical therapy instructions that you received in the hospital   Increase activity slowly as tolerated   Complete by: As directed    Post-operative opioid taper instructions:   Complete by: As directed    POST-OPERATIVE OPIOID TAPER INSTRUCTIONS: It is important to wean off of your opioid medication as soon as possible. If you do not need pain medication after your surgery it is ok to stop day one. Opioids include: Codeine , Hydrocodone (Norco, Vicodin), Oxycodone(Percocet, oxycontin) and hydromorphone amongst others.  Long term and even short term use of opiods can cause: Increased pain response Dependence Constipation Depression Respiratory depression And more.  Withdrawal symptoms can include Flu like symptoms Nausea, vomiting And  more Techniques to manage these symptoms Hydrate well Eat regular healthy meals Stay active Use relaxation techniques(deep breathing, meditating, yoga) Do Not substitute Alcohol to help with tapering If you have been on opioids for less than two weeks and do not have pain than it is ok to stop all together.  Plan to wean off of opioids This plan should start within one week post op of your joint replacement. Maintain the same interval or time between taking each dose and first decrease the dose.  Cut the total daily intake of opioids by one tablet each day Next start to increase the time between doses. The last dose that should be eliminated is the evening  dose.         Allergies as of 09/12/2024       Reactions   Codeine          Medication List     TAKE these medications    acetaminophen  500 MG tablet Commonly known as: TYLENOL  Take 1,000 mg by mouth every 6 (six) hours as needed for mild pain.   ALPRAZolam 0.25 MG tablet Commonly known as: Xanax Take 1 tablet (0.25 mg total) by mouth at bedtime as needed for anxiety.   aspirin EC 81 MG tablet Take 1 tablet (81 mg total) by mouth daily. Swallow whole.   cyclobenzaprine 10 MG tablet Commonly known as: FLEXERIL Take 1 tablet (10 mg total) by mouth 3 (three) times daily as needed for muscle spasms.   docusate sodium 100 MG capsule Commonly known as: COLACE Take 1 capsule (100 mg total) by mouth 2 (two) times daily as needed for mild constipation.   HYDROcodone -acetaminophen  10-325 MG tablet Commonly known as: NORCO Take 1 tablet by mouth every 4 (four) hours as needed for up to 7 days.               Durable Medical Equipment  (From admission, onward)           Start     Ordered   09/11/24 1447  For home use only DME Walker rolling  Once       Question Answer Comment  Walker: With 5 Inch Wheels   Patient needs a walker to treat with the following condition Gait instability      09/11/24 1446            Contact information for follow-up providers     Margrette Taft BRAVO, MD. Go on 09/25/2024.   Specialties: Orthopedic Surgery, Radiology Why: Hospital Follow Up, For wound re-check Contact information: [address] [place] [address] [phone]              Contact information for after-discharge care     Home Medical Care     [facility]) .   Service: Home Health Services Contact information: [phone] [place]  [address] [phone]                     Signed: Taft Margrette 09/12/2024, 8:44 AM

## 2024-09-12 NOTE — Plan of Care (Signed)
   Problem: Education: Goal: Knowledge of General Education information will improve Description Including pain rating scale, medication(s)/side effects and non-pharmacologic comfort measures Outcome: Progressing   Problem: Health Behavior/Discharge Planning: Goal: Ability to manage health-related needs will improve Outcome: Progressing

## 2024-09-25 ENCOUNTER — Encounter: Payer: Self-pay | Admitting: Orthopedic Surgery

## 2024-09-25 ENCOUNTER — Other Ambulatory Visit (INDEPENDENT_AMBULATORY_CARE_PROVIDER_SITE_OTHER)

## 2024-09-25 ENCOUNTER — Ambulatory Visit: Payer: PRIVATE HEALTH INSURANCE | Admitting: Orthopedic Surgery

## 2024-09-25 DIAGNOSIS — S72002D Fracture of unspecified part of neck of left femur, subsequent encounter for closed fracture with routine healing: Secondary | ICD-10-CM

## 2024-09-25 NOTE — Progress Notes (Signed)
° ° °  09/25/2024   Chief Complaint  Patient presents with   Post-op Follow-up    Left hip     Encounter Diagnosis  Name Primary?   Closed fracture of left hip with routine healing, subsequent encounter 09/10/24 perc. screw  fixation Yes    What pharmacy do you use ? _____[place]____________________  DOI/DOS/ Date: 09/10/24  Did you get better, worse or no change (Answer below)   Improved

## 2024-09-25 NOTE — Progress Notes (Signed)
° °  POST OP VISIT   Patient: Lisa Mullins           Date of Birth: [birth year]           MRN: [id number] Visit Date: 09/25/2024 Requested by: No referring provider defined for this encounter. PCP: Patient, No Pcp Per   Encounter Diagnosis  Name Primary?   Closed fracture of left hip with routine healing, subsequent encounter 09/10/24 perc. screw  fixation Yes   PROCEDURE: Closed reduction percutaneous screw fixation left hip for femoral neck fracture  Chief Complaint  Patient presents with   Post-op Follow-up    Left hip     Allergies[1]   Current Medications[2]   DVT aspirin  81 mg daily IMAGING: DG HIP UNILAT WITH PELVIS 2-3 VIEWS LEFT Result Date: 09/25/2024 X-ray of left hip and pelvis status post cannulated screw fixation Femoral neck fracture X-rays show 3 screws with washers screws are in inverted triangle position fracture looks reduced and stable no complications from the hardware     ASSESSMENT AND PLAN:  Wound clean okay to shower  The patient indicates she stopped using her walker full-time but I advised her to use a walker or cane for another 2 weeks  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  Per [facility] clinic policy, our goal is ensure optimal postoperative pain control with a multimodal pain management strategy. For all [facility] patients, our goal is to wean post-operative narcotic medications by 6 weeks post-operatively. If this is not possible due to utilization of pain medication prior to surgery, your [facility] doctor will support your acute post-operative pain control for the first 6 weeks postoperatively, with a plan to transition you back  to your primary pain team following that. Maralee will work to ensure a therapist, occupational.     [1]  Allergies Allergen Reactions   Codeine    [2]  Current Outpatient Medications:    acetaminophen  (TYLENOL ) 500 MG tablet, Take 1,000 mg by mouth every 6 (six) hours as needed for mild pain. , Disp: , Rfl:    ALPRAZolam  (XANAX ) 0.25 MG tablet, Take 1 tablet (0.25 mg total) by mouth at bedtime as needed for anxiety., Disp: 30 tablet, Rfl: 0   aspirin  EC 81 MG tablet, Take 1 tablet (81 mg total) by mouth daily. Swallow whole., Disp: 150 tablet, Rfl: 2   cyclobenzaprine  (FLEXERIL ) 10 MG tablet, Take 1 tablet (10 mg total) by mouth 3 (three) times daily as needed for muscle spasms., Disp: 60 tablet, Rfl: 1   docusate sodium  (COLACE) 100 MG capsule, Take 1 capsule (100 mg total) by mouth 2 (two) times daily as needed for mild constipation., Disp: , Rfl:

## 2024-10-23 ENCOUNTER — Ambulatory Visit (INDEPENDENT_AMBULATORY_CARE_PROVIDER_SITE_OTHER): Payer: PRIVATE HEALTH INSURANCE | Admitting: Orthopedic Surgery

## 2024-10-23 ENCOUNTER — Other Ambulatory Visit: Payer: Self-pay

## 2024-10-23 ENCOUNTER — Encounter: Payer: Self-pay | Admitting: Orthopedic Surgery

## 2024-10-23 ENCOUNTER — Other Ambulatory Visit: Payer: Self-pay | Admitting: Orthopedic Surgery

## 2024-10-23 DIAGNOSIS — E2839 Other primary ovarian failure: Secondary | ICD-10-CM

## 2024-10-23 DIAGNOSIS — S72002D Fracture of unspecified part of neck of left femur, subsequent encounter for closed fracture with routine healing: Secondary | ICD-10-CM

## 2024-10-23 NOTE — Patient Instructions (Signed)
 While we are working on your approval for bone density study please go ahead and call to schedule your appointment with [facility] within at least one (1) week.   Central Scheduling [phone]

## 2024-10-23 NOTE — Progress Notes (Addendum)
" ° °  POST OP APPT 2  Encounter Diagnosis  Name Primary?   Closed fracture of left hip with routine healing, subsequent encounter 09/10/24 perc. screw  fixation Yes        Chief Complaint  Patient presents with   Post-op Follow-up    INCISION: Clean dry and intact healed no sign of infection  Ortho Exam  Normal flexion of the hip no leg length discrepancy no pain with range of motion of the hip  X-ray shows 3 cannulated screws fracture actually looks healed no signs of collapse or AVN  Current Medications[1]   DG HIP UNILAT W OR W/O PELVIS 2-3 VIEWS LEFT Result Date: 10/23/2024 Images of the left hip Unilateral hip with pelvis 2-3 views Diagnosis left hip fracture Status post open treatment internal fixation with cannulated screws The images show that the screws x 3 are intact and have not migrated The fracture line is barely visible indicating appropriate healing with surrounding sclerosis Impression probable healed femoral neck fracture with internal fixation without complication     IF FRAGILITY FRACTURE/ REFERRAL: not applicable Bone density ordered   OPIOID MANAGEMENT: Not applicable  PLAN:  Doing well postoperatively.  After bone density     [1]  "

## 2024-10-23 NOTE — Progress Notes (Signed)
" ° ° °  10/23/2024   Chief Complaint  Patient presents with   Post-op Follow-up    Encounter Diagnosis  Name Primary?   Closed fracture of left hip with routine healing, subsequent encounter 09/10/24 perc. screw  fixation Yes    What pharmacy do you use ? ___________________________  DOI/DOS/ Date: 09/10/24  Did you get better, worse or no change (Answer below)   Improved      "

## 2024-10-23 NOTE — Addendum Note (Signed)
 Addended byBETHA JENEAN GREIG LELON on: 10/23/2024 10:16 AM   Modules accepted: Orders

## 2024-11-12 ENCOUNTER — Ambulatory Visit (HOSPITAL_COMMUNITY)
Admission: RE | Admit: 2024-11-12 | Discharge: 2024-11-12 | Disposition: A | Payer: PRIVATE HEALTH INSURANCE | Source: Ambulatory Visit | Attending: Orthopedic Surgery | Admitting: Orthopedic Surgery

## 2024-11-12 DIAGNOSIS — S72002D Fracture of unspecified part of neck of left femur, subsequent encounter for closed fracture with routine healing: Secondary | ICD-10-CM

## 2024-11-12 DIAGNOSIS — E2839 Other primary ovarian failure: Secondary | ICD-10-CM
